# Patient Record
Sex: Female | Born: 1970 | Race: Black or African American | Hispanic: No | Marital: Married | Smoking: Never smoker
Health system: Southern US, Community
[De-identification: ages and names within clinical notes are randomized; demographics above are authoritative.]

## PROBLEM LIST (undated history)

## (undated) ENCOUNTER — Ambulatory Visit: Payer: BC Managed Care – PPO

## (undated) DIAGNOSIS — I10 Essential (primary) hypertension: Secondary | ICD-10-CM

## (undated) DIAGNOSIS — D649 Anemia, unspecified: Secondary | ICD-10-CM

## (undated) DIAGNOSIS — Z8639 Personal history of other endocrine, nutritional and metabolic disease: Secondary | ICD-10-CM

## (undated) DIAGNOSIS — E119 Type 2 diabetes mellitus without complications: Secondary | ICD-10-CM

## (undated) DIAGNOSIS — D251 Intramural leiomyoma of uterus: Secondary | ICD-10-CM

## (undated) DIAGNOSIS — I82409 Acute embolism and thrombosis of unspecified deep veins of unspecified lower extremity: Secondary | ICD-10-CM

## (undated) DIAGNOSIS — G4730 Sleep apnea, unspecified: Secondary | ICD-10-CM

## (undated) HISTORY — PX: TONSILLECTOMY: SUR1361

## (undated) HISTORY — PX: TUBAL LIGATION: SHX77

## (undated) HISTORY — PX: BARIATRIC SURGERY: SHX1103

## (undated) HISTORY — PX: NASAL POLYP EXCISION: SHX2068

## (undated) HISTORY — PX: ENDOMETRIAL BIOPSY: SHX622

---

## 2006-02-22 ENCOUNTER — Ambulatory Visit: Payer: Self-pay | Admitting: Family Medicine

## 2006-10-03 ENCOUNTER — Ambulatory Visit: Payer: Self-pay | Admitting: Internal Medicine

## 2008-08-20 ENCOUNTER — Ambulatory Visit: Payer: Self-pay | Admitting: Family Medicine

## 2008-08-25 ENCOUNTER — Ambulatory Visit: Payer: Self-pay | Admitting: Family Medicine

## 2009-04-08 ENCOUNTER — Ambulatory Visit: Payer: Self-pay | Admitting: Otolaryngology

## 2009-06-09 ENCOUNTER — Ambulatory Visit: Payer: Self-pay | Admitting: Otolaryngology

## 2009-06-14 ENCOUNTER — Ambulatory Visit: Payer: Self-pay | Admitting: Otolaryngology

## 2010-05-19 ENCOUNTER — Ambulatory Visit: Payer: Self-pay | Admitting: Family Medicine

## 2010-06-10 ENCOUNTER — Ambulatory Visit: Payer: Self-pay | Admitting: Specialist

## 2010-06-20 ENCOUNTER — Ambulatory Visit: Payer: Self-pay | Admitting: Specialist

## 2010-07-06 ENCOUNTER — Ambulatory Visit: Payer: Self-pay | Admitting: Specialist

## 2010-07-19 ENCOUNTER — Ambulatory Visit: Payer: Self-pay | Admitting: Specialist

## 2010-07-20 ENCOUNTER — Ambulatory Visit: Payer: Self-pay | Admitting: Gastroenterology

## 2010-08-10 ENCOUNTER — Ambulatory Visit: Payer: Self-pay | Admitting: Specialist

## 2010-10-09 ENCOUNTER — Ambulatory Visit: Payer: Self-pay | Admitting: Internal Medicine

## 2010-10-09 ENCOUNTER — Inpatient Hospital Stay: Payer: Self-pay | Admitting: Specialist

## 2011-01-30 ENCOUNTER — Ambulatory Visit: Payer: Self-pay | Admitting: Family Medicine

## 2011-03-21 DIAGNOSIS — I82409 Acute embolism and thrombosis of unspecified deep veins of unspecified lower extremity: Secondary | ICD-10-CM

## 2011-03-21 HISTORY — DX: Acute embolism and thrombosis of unspecified deep veins of unspecified lower extremity: I82.409

## 2012-05-07 ENCOUNTER — Ambulatory Visit: Payer: Self-pay | Admitting: Anesthesiology

## 2012-05-09 ENCOUNTER — Ambulatory Visit: Payer: Self-pay | Admitting: Otolaryngology

## 2014-03-11 ENCOUNTER — Ambulatory Visit: Payer: Self-pay | Admitting: Family Medicine

## 2014-03-26 ENCOUNTER — Ambulatory Visit: Payer: Self-pay | Admitting: Family Medicine

## 2015-05-07 ENCOUNTER — Other Ambulatory Visit: Payer: Self-pay | Admitting: Family Medicine

## 2015-05-07 DIAGNOSIS — Z1231 Encounter for screening mammogram for malignant neoplasm of breast: Secondary | ICD-10-CM

## 2015-05-10 ENCOUNTER — Ambulatory Visit
Admission: RE | Admit: 2015-05-10 | Discharge: 2015-05-10 | Disposition: A | Discharge status: Home / Self Care | Payer: BLUE CROSS/BLUE SHIELD | Source: Ambulatory Visit | Attending: Family Medicine | Admitting: Family Medicine

## 2015-05-10 DIAGNOSIS — Z1231 Encounter for screening mammogram for malignant neoplasm of breast: Secondary | ICD-10-CM | POA: Diagnosis not present

## 2015-09-13 IMAGING — MG MM DIGITAL SCREENING BILAT W/ CAD
1 series · 4 of 4 positions shown · non-contrast
Comparison: Previous exam(s).

CLINICAL DATA: Screening.

EXAM:
DIGITAL SCREENING BILATERAL MAMMOGRAM WITH CAD

[R CC · right · 4 of 4 slices shown]
[im 1/4]
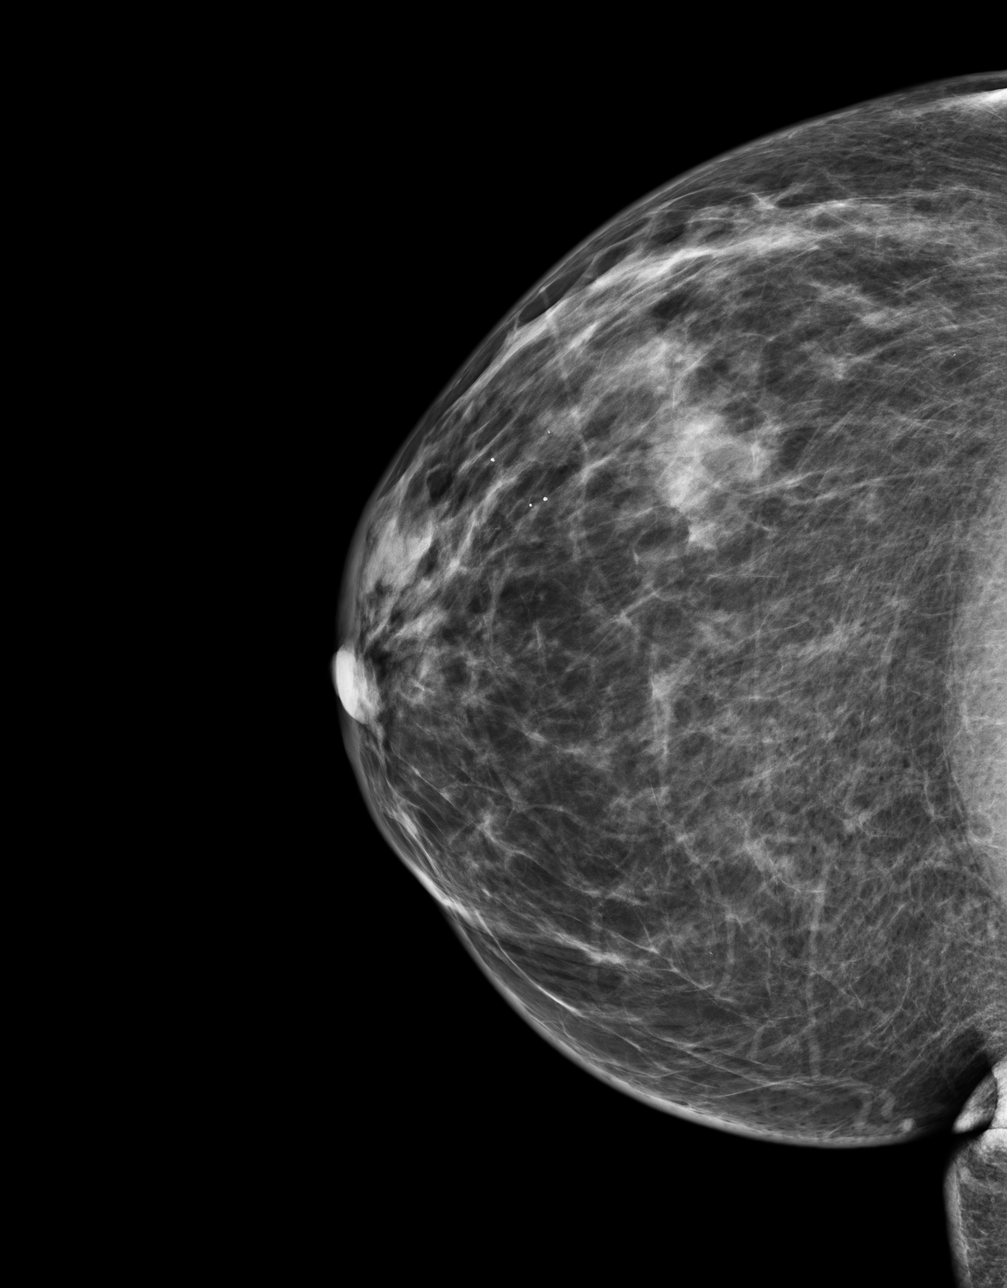
[im 2/4]
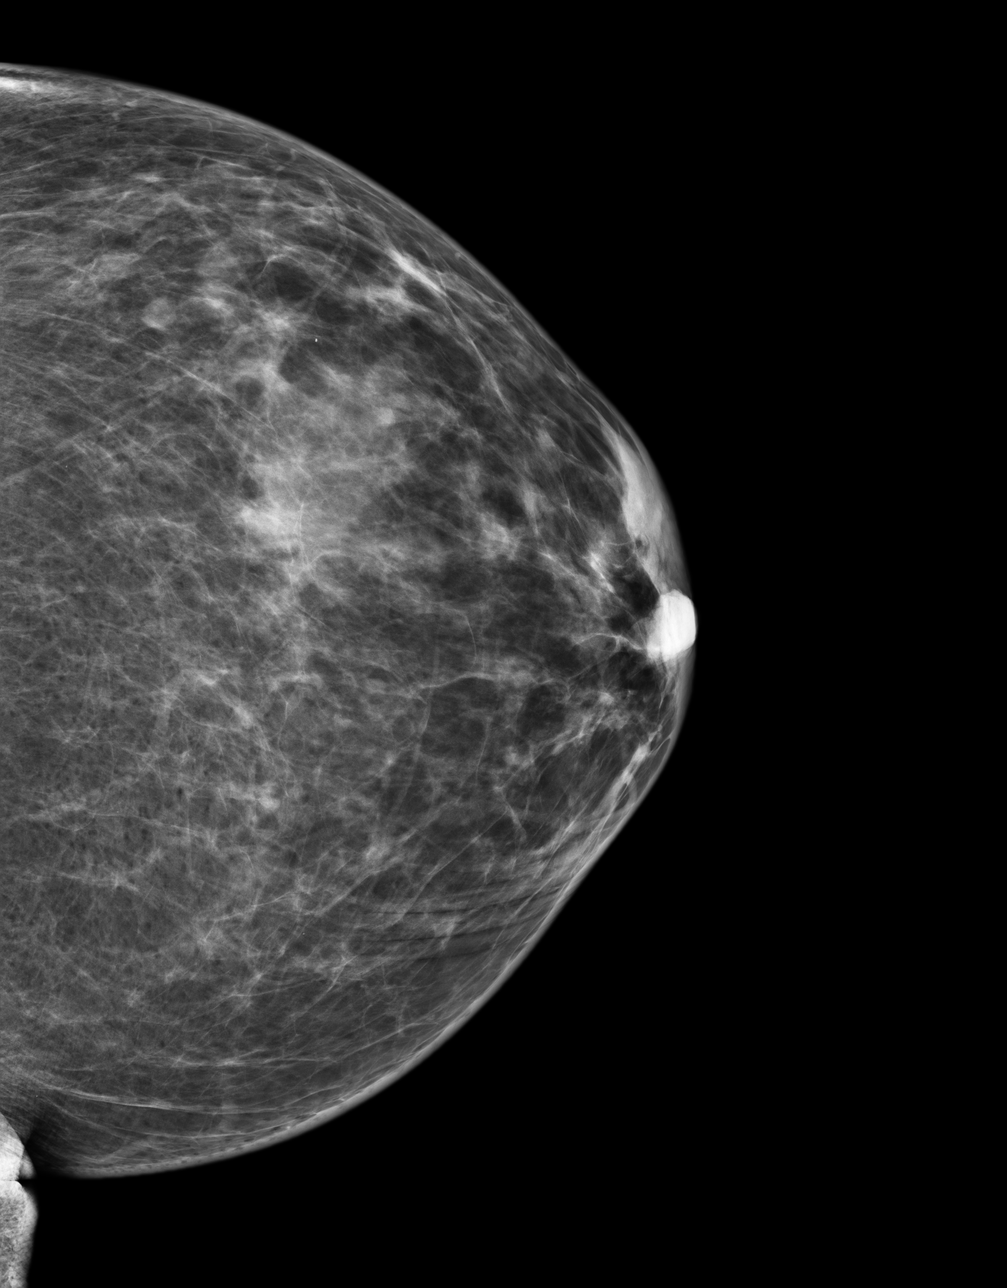
[im 3/4]
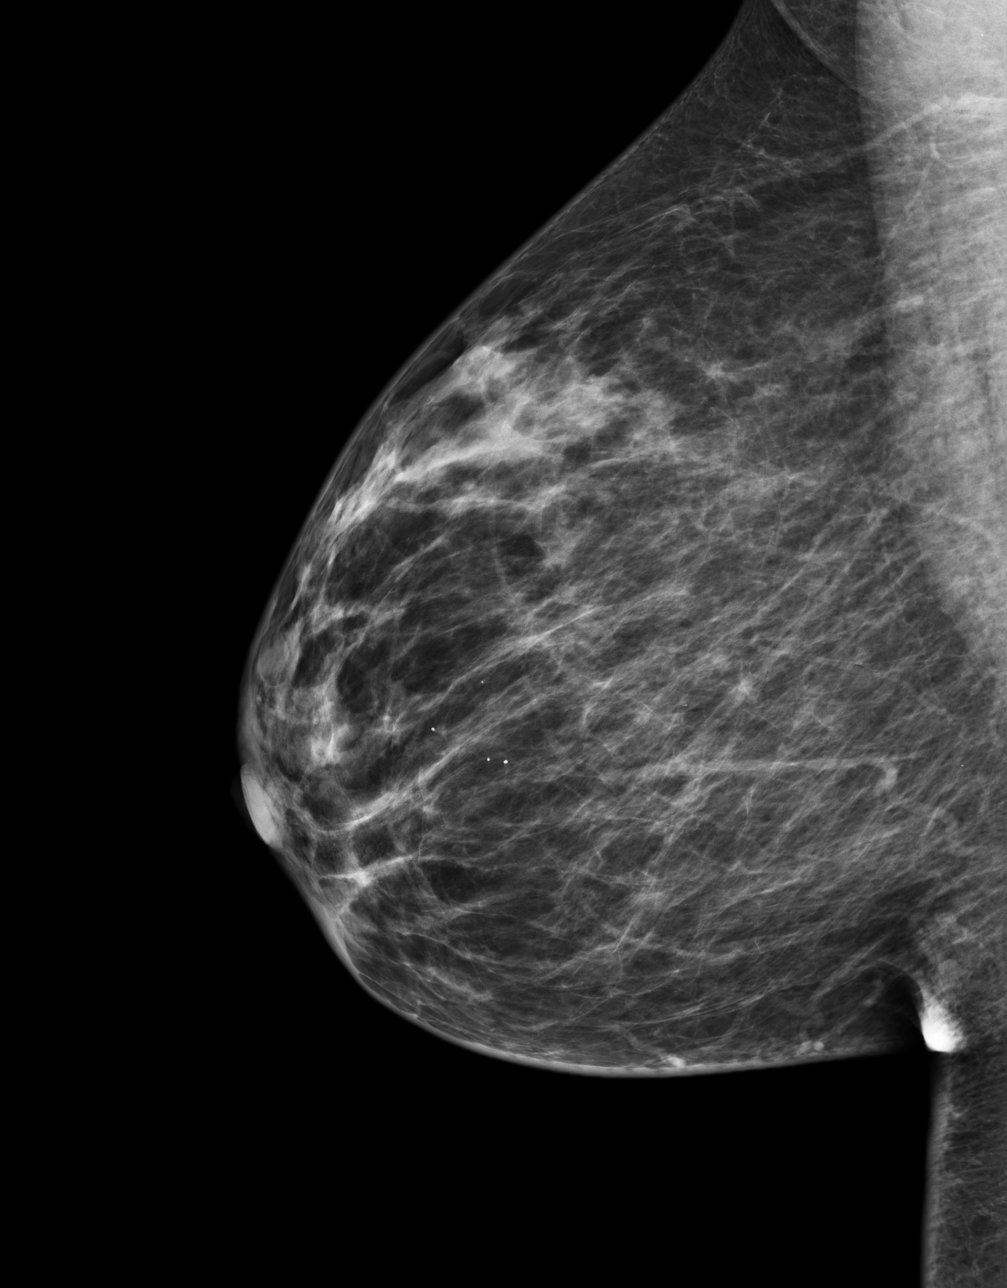
[im 4/4]
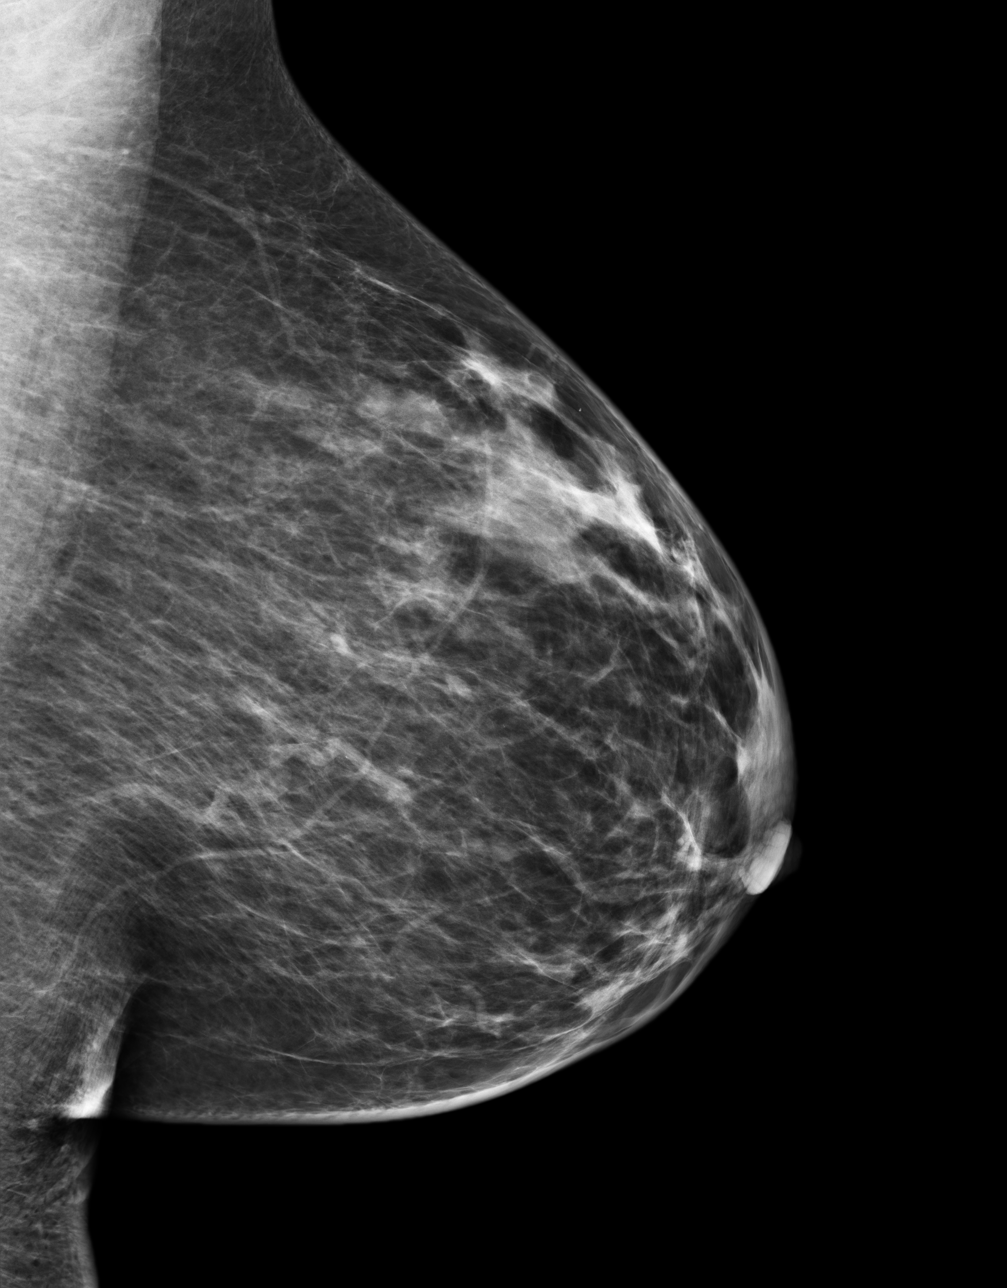

[4 of 4 positions shown; findings below may reference images not displayed]

ACR Breast Density Category c: The breast tissue is heterogeneously
dense, which may obscure small masses.
FINDINGS: There are no findings suspicious for malignancy. Images were
processed with CAD.
IMPRESSION: No mammographic evidence of malignancy. A result letter of this
screening mammogram will be mailed directly to the patient.

RECOMMENDATION:
Screening mammogram in one year. (Code:[id number])

BI-RADS CATEGORY  1: Negative.

## 2016-01-01 ENCOUNTER — Encounter: Payer: Self-pay | Admitting: Gynecology

## 2016-01-01 ENCOUNTER — Ambulatory Visit
Admission: RE | Admit: 2016-01-01 | Discharge: 2016-01-01 | Disposition: A | Discharge status: Home / Self Care | Payer: 59 | Source: Ambulatory Visit | Attending: Family Medicine | Admitting: Family Medicine

## 2016-01-01 ENCOUNTER — Ambulatory Visit
Admission: EM | Admit: 2016-01-01 | Discharge: 2016-01-01 | Disposition: A | Discharge status: Home / Self Care | Payer: 59 | Attending: Family Medicine | Admitting: Family Medicine

## 2016-01-01 DIAGNOSIS — M79661 Pain in right lower leg: Secondary | ICD-10-CM | POA: Insufficient documentation

## 2016-01-01 DIAGNOSIS — R600 Localized edema: Secondary | ICD-10-CM | POA: Insufficient documentation

## 2016-01-01 DIAGNOSIS — M79604 Pain in right leg: Secondary | ICD-10-CM | POA: Diagnosis not present

## 2016-01-01 HISTORY — DX: Acute embolism and thrombosis of unspecified deep veins of unspecified lower extremity: I82.409

## 2016-01-01 HISTORY — DX: Intramural leiomyoma of uterus: D25.1

## 2016-01-01 HISTORY — DX: Type 2 diabetes mellitus without complications: E11.9

## 2016-01-01 HISTORY — DX: Sleep apnea, unspecified: G47.30

## 2016-01-01 HISTORY — DX: Essential (primary) hypertension: I10

## 2016-01-01 HISTORY — DX: Personal history of other endocrine, nutritional and metabolic disease: Z86.39

## 2016-01-01 HISTORY — DX: Anemia, unspecified: D64.9

## 2016-01-01 MED ORDER — HYDROCODONE-ACETAMINOPHEN 5-325 MG PO TABS: ORAL_TABLET | ORAL | 0 refills | Status: DC

## 2016-01-01 NOTE — ED Triage Notes (Signed)
Per patient notice x 5 days ago right leg swollen and painful.

## 2016-01-01 NOTE — ED Provider Notes (Signed)
[facility]    CSN: [id number] Arrival date & time: 01/01/16  1132     History   Chief Complaint Chief Complaint  Patient presents with  . Leg Pain    HPI Debra Nunez is a 45 y.o. female.   45 yo female with a 5 days h/o right leg pain and swelling mainly around the calf. States she has a prior h/o DVT years ago and was on a 3 hour flight 1 week ago (2 days prior to current symptoms). Denies any trauma/injury, fevers, chills, chest pain or shortness of breath.    The history is provided by the patient.  Leg Pain  Location:  Leg Injury: no     Past Medical History:  Diagnosis Date  . Anemia   . Diabetes mellitus without complication ([place])   . DVT (deep venous thrombosis) ([place])   . Fibroids, intramural   . H/O vitamin D deficiency   . Hypertension   . Intramural leiomyoma of uterus   . Sleep apnea   . Stroke [facility])     There are no active problems to display for this patient.   Past Surgical History:  Procedure Laterality Date  . BARIATRIC SURGERY    . ENDOMETRIAL BIOPSY    . TONSILLECTOMY    . TUBAL LIGATION      OB History    No data available       Home Medications    Prior to Admission medications   Medication Sig Start Date End Date Taking? Authorizing Provider  amLODipine (NORVASC) 5 MG tablet Take 5 mg by mouth daily.   Yes Historical Provider, MD  CALCIUM MAGNESIUM 750 PO Take by mouth.   Yes Historical Provider, MD  clonazePAM (KLONOPIN) 0.5 MG tablet Take 0.5 mg by mouth 2 (two) times daily as needed for anxiety.   Yes Historical Provider, MD  cyanocobalamin 1000 MCG tablet Take 5,000 mcg by mouth daily.   Yes Historical Provider, MD  ergocalciferol (VITAMIN D2) 50000 units capsule Take 50,000 Units by mouth once a week.   Yes Historical Provider, MD  ferrous sulfate (FERRO-BOB) 325 (65 FE) MG tablet Take 325 mg by mouth daily with breakfast.   Yes Historical Provider, MD  hydrochlorothiazide (HYDRODIURIL) 25 MG tablet  Take 25 mg by mouth daily.   Yes Historical Provider, MD  leuprolide (LUPRON) 11.25 MG injection Inject 11.25 mg into the muscle every 3 (three) months.   Yes Historical Provider, MD  HYDROcodone-acetaminophen (NORCO/VICODIN) 5-325 MG tablet 1-2 tabs po bid prn 01/01/16   Norval Gable, MD    Family History No family history on file.  Social History Social History  Substance Use Topics  . Smoking status: Not on file  . Smokeless tobacco: Not on file  . Alcohol use Not on file     Allergies   Tramadol   Review of Systems Review of Systems   Physical Exam Triage Vital Signs ED Triage Vitals  Enc Vitals Group     BP 01/01/16 1200 125/60     Pulse Rate 01/01/16 1200 72     Resp 01/01/16 1200 18     Temp 01/01/16 1200 98.2 F (36.8 C)     Temp Source 01/01/16 1200 Oral     SpO2 01/01/16 1200 100 %     Weight 01/01/16 1203 252 lb (114.3 kg)     Height 01/01/16 1203 5\' 7"  (1.702 m)     Head Circumference --      Peak  Flow --      Pain Score 01/01/16 1206 6     Pain Loc --      Pain Edu? --      Excl. in [place]? --    No data found.   Updated Vital Signs BP 125/60 (BP Location: Right Arm)   Pulse 72   Temp 98.2 F (36.8 C) (Oral)   Resp 18   Ht 5\' 7"  (1.702 m)   Wt 252 lb (114.3 kg)   LMP 06/01/2015 Comment: taking Lupron,depo  SpO2 100%   BMI 39.47 kg/m   Visual Acuity Right Eye Distance:   Left Eye Distance:   Bilateral Distance:    Right Eye Near:   Left Eye Near:    Bilateral Near:     Physical Exam   UC Treatments / Results  Labs (all labs ordered are listed, but only abnormal results are displayed) Labs Reviewed - No data to display  EKG  EKG Interpretation None       Radiology US Venous Img Lower Unilateral Right  Result Date: 01/01/2016 CLINICAL DATA:  45 year old female with a history of lower leg pain EXAM: Right LOWER EXTREMITY VENOUS DOPPLER ULTRASOUND TECHNIQUE: Gray-scale sonography with graded compression, as well as color  Doppler and duplex ultrasound were performed to evaluate the lower extremity deep venous systems from the level of the common femoral vein and including the common femoral, femoral, profunda femoral, popliteal and calf veins including the posterior tibial, peroneal and gastrocnemius veins when visible. The superficial great saphenous vein was also interrogated. Spectral Doppler was utilized to evaluate flow at rest and with distal augmentation maneuvers in the common femoral, femoral and popliteal veins. COMPARISON:  01/30/2011, 10/09/2010 FINDINGS: Contralateral Common Femoral Vein: Respiratory phasicity is normal and symmetric with the symptomatic side. No evidence of thrombus. Normal compressibility. Common Femoral Vein: No evidence of thrombus. Normal compressibility, respiratory phasicity and response to augmentation. Saphenofemoral Junction: No evidence of thrombus. Normal compressibility and flow on color Doppler imaging. Profunda Femoral Vein: No evidence of thrombus. Normal compressibility and flow on color Doppler imaging. Femoral Vein: No evidence of thrombus. Normal compressibility, respiratory phasicity and response to augmentation. Popliteal Vein: No evidence of thrombus. Normal compressibility, respiratory phasicity and response to augmentation. Calf Veins: No evidence of thrombus. Normal compressibility and flow on color Doppler imaging. Superficial Great Saphenous Vein: No evidence of thrombus. Normal compressibility and flow on color Doppler imaging. Other Findings:  Edema of the right lower extremity peer IMPRESSION: Sonographic survey right lower extremity negative for DVT. Edema of the superficial tissues, right lower leg. Signed, Dulcy Fanny. Earleen Newport, DO [facility] Radiology Electronically Signed   By: Corrie Mckusick D.O.   On: 01/01/2016 13:43    Procedures Procedures (including critical care time)  Medications Ordered in UC Medications - No  data to display   Initial Impression / Assessment and Plan / UC Course  I have reviewed the triage vital signs and the nursing notes.  Pertinent labs & imaging results that were available during my care of the patient were reviewed by me and considered in my medical decision making (see chart for details).  Clinical Course      Final Clinical Impressions(s) / UC Diagnoses   Final diagnoses:  Right leg pain  Right calf pain    New Prescriptions Discharge Medication List as of 01/01/2016 12:23 PM    START taking these medications   Details  HYDROcodone-acetaminophen (NORCO/VICODIN) 5-325 MG tablet 1-2 tabs  po bid prn, Print        1. x-ray results (venous doppler of LE negative for DVT) and diagnosis reviewed with patient 2. rx as per orders above; reviewed possible side effects, interactions, risks and benefits  3. Recommend supportive treatment with rest, elevation 4. Follow-up prn if symptoms worsen or don't improve   Norval Gable, MD 01/01/16 1355

## 2016-04-20 HISTORY — PX: ABDOMINAL HYSTERECTOMY: SHX81

## 2016-04-25 NOTE — H&P (Signed)
Debra Nunez is a 46 y.o. female here for [facility] and bilateral salpingectomy  .pt with [facility]  and is here to discuss definitive tx ie .Marland Kitchen TVH . Pt with embx 04/2015 , Pap 1/18 neg .  U/S shows at least 3 fibroids 4-6 cm .  + H/o DVT   Past Medical History:  has a past medical history of Abnormal uterine bleeding, unspecified; Allergic state; Anemia, unspecified; Constipation due to slow transit; Diabetes mellitus type 2, uncomplicated (CMS-HCC); DVT of axillary vein, acute right (CMS-HCC) (04/23/2015); History of DVT (deep vein thrombosis) (04/27/2015); Hypertension; Other nonspecific abnormal finding; and Sleep apnea.  Past Surgical History:  has a past surgical history that includes Tonsillectomy; Tubal ligation; Bariatric Surgery; and Endometrial Biopsy W/Wo Endocerv Biopsy [id number]) (05/13/2015). Family History: family history includes Allergic rhinitis in her mother; Diabetes type II in her mother; High blood pressure (Hypertension) in her father and mother; Obesity in her sister. Social History:  reports that she has never smoked. She has never used smokeless tobacco. She reports that she drinks alcohol. She reports that she does not use drugs. OB/GYN History:          OB History    Gravida Para Term Preterm AB Living   2 2 2     2    SAB TAB Ectopic Molar Multiple Live Births             2      Allergies: is allergic to amlodipine and ultram [tramadol]. Medications:  Current Outpatient Prescriptions:  .  cyanocobalamin, vitamin B-12, (VITAMIN B-12) 5,000 mcg Subl, Place under the tongue., Disp: , Rfl:  .  ergocalciferol, vitamin D2, (VITAMIN D2) 50,000 unit capsule, Take 50,000 Units by mouth once a week., Disp: , Rfl:  .  ferrous sulfate 325 (65 FE) MG tablet, Take 325 mg by mouth daily with breakfast., Disp: , Rfl:  .  hydroCHLOROthiazide (HYDRODIURIL) 25 MG tablet, TAKE 1 TABLET BY MOUTH EVERY DAY AS NEEDED, Disp: 30 tablet, Rfl: prn .  clonazePAM (KLONOPIN) 0.5 MG tablet, Take 1 tablet  (0.5 mg total) by mouth once daily as needed for Anxiety. (Patient not taking: Reported on 04/10/2016 ), Disp: 10 tablet, Rfl: 0  Review of Systems: General:                      No fatigue or weight loss Eyes:                           No vision changes Ears:                            No hearing difficulty Respiratory:                No cough or shortness of breath Pulmonary:                  No asthma or shortness of breath Cardiovascular:           No chest pain, palpitations, dyspnea on exertion Gastrointestinal:          No abdominal bloating, chronic diarrhea, constipations, masses, pain or hematochezia Genitourinary:             No hematuria, dysuria, abnormal vaginal discharge, pelvic pain, Menometrorrhagia Lymphatic:                   No swollen lymph nodes Musculoskeletal:  No muscle weakness Neurologic:                  No extremity weakness, syncope, seizure disorder Psychiatric:                  No history of depression, delusions or suicidal/homicidal ideation    Exam:      Vitals:   04/10/16 0951  BP: 134/83  Pulse: 83    Body mass index is 39.78 kg/m.  WDWN / black female in NAD   Lungs: CTA  CV : RRR without murmur   Breast: exam done in sitting and lying position : No dimpling or retraction, no dominant mass, no spontaneous discharge, no axillary adenopathy Neck:  no thyromegaly Abdomen: soft , no mass, normal active bowel sounds,  non-tender, no rebound tenderness Pelvic: tanner stage 5 ,  External genitalia: vulva /labia no lesions Urethra: no prolapse Vagina: normal physiologic d/c, adequate room for TVH  Cervix: no lesions, no cervical motion tenderness   Uterus: 12 weeks irregular  Adnexa: no mass,  non-tender   Rectovaginal: no mass heme negative  Impression:   The primary encounter diagnosis was Menorrhagia with regular cycle. A diagnosis of Fibroids, intramural was also pertinent to this visit.    Plan:  Benefits and  risks to surgery: TVH and bilateral salpingectomy  The proposed benefit of the surgery has been discussed with the patient. The possible risks include, but are not limited to: organ injury to the bowel , bladder, ureters, and major blood vessels and nerves. There is a possibility of additional surgeries resulting from these injuries. There is also the risk of blood transfusion and the need to receive blood products during or after the procedure which may rarely lead to HIV or Hepatitis C infection. There is a risk of developing a deep venous thrombosis or a pulmonary embolism . There is the possibility of wound infection and also anesthetic complications, even the rare possibility of death. The patient understands these risks and wishes to proceed. All questions have been answered and the consent has been signed. I have spoken with the patient regarding treatment options including expectant management, hormonal options, or surgical intervention. After a full discussion the pt elects to proceed [place] . Pt is not a candidate for combination OCP given her h/o DVT  Offered LSH , but with dyspareunia I think she would be best served with removal of the cervix     Caroline Sauger, MD

## 2016-04-26 ENCOUNTER — Encounter
Admission: RE | Admit: 2016-04-26 | Discharge: 2016-04-26 | Disposition: A | Discharge status: Home / Self Care | Payer: 59 | Source: Ambulatory Visit | Attending: Obstetrics and Gynecology | Admitting: Obstetrics and Gynecology

## 2016-04-26 DIAGNOSIS — Z01818 Encounter for other preprocedural examination: Secondary | ICD-10-CM | POA: Diagnosis not present

## 2016-04-26 DIAGNOSIS — D259 Leiomyoma of uterus, unspecified: Secondary | ICD-10-CM | POA: Insufficient documentation

## 2016-04-26 DIAGNOSIS — I10 Essential (primary) hypertension: Secondary | ICD-10-CM | POA: Insufficient documentation

## 2016-04-26 DIAGNOSIS — N920 Excessive and frequent menstruation with regular cycle: Secondary | ICD-10-CM | POA: Insufficient documentation

## 2016-04-26 DIAGNOSIS — Z0183 Encounter for blood typing: Secondary | ICD-10-CM | POA: Diagnosis not present

## 2016-04-26 DIAGNOSIS — Z01812 Encounter for preprocedural laboratory examination: Secondary | ICD-10-CM | POA: Diagnosis not present

## 2016-04-26 LAB — BASIC METABOLIC PANEL
Anion gap: 5 (ref 5–15)
BUN: 13 mg/dL (ref 6–20)
CHLORIDE: 107 mmol/L (ref 101–111)
CO2: 29 mmol/L (ref 22–32)
CREATININE: 0.72 mg/dL (ref 0.44–1.00)
Calcium: 8.5 mg/dL — ABNORMAL LOW (ref 8.9–10.3)
GFR calc Af Amer: >60 mL/min (ref >60)
GFR calc non Af Amer: >60 mL/min (ref >60)
GLUCOSE: 120 mg/dL — AB (ref 65–99)
POTASSIUM: 3.7 mmol/L (ref 3.5–5.1)
SODIUM: 141 mmol/L (ref 135–145)

## 2016-04-26 LAB — TYPE AND SCREEN
ABO/RH(D): O POS
Antibody Screen: NEGATIVE

## 2016-04-26 LAB — CBC
HEMATOCRIT: 37.6 % (ref 35.0–47.0)
Hemoglobin: 12.6 g/dL (ref 12.0–16.0)
MCH: 29.8 pg (ref 26.0–34.0)
MCHC: 33.5 g/dL (ref 32.0–36.0)
MCV: 89.2 fL (ref 80.0–100.0)
PLATELETS: 257 10*3/uL (ref 150–440)
RBC: 4.21 MIL/uL (ref 3.80–5.20)
RDW: 12.2 % (ref 11.5–14.5)
WBC: 5.5 10*3/uL (ref 3.6–11.0)

## 2016-04-26 NOTE — Patient Instructions (Signed)
  Your procedure is scheduled on: 05-01-16 [facility]) Report to Same Day Surgery 2nd floor medical mall [facility] Entrance-take elevator on left to 2nd floor.  Check in with surgery information desk.) To find out your arrival time please call [phone] between 1PM - 3PM on 04-28-16 (FRIDAY)  Remember: Instructions that are not followed completely may result in serious medical risk, up to and including death, or upon the discretion of your surgeon and anesthesiologist your surgery may need to be rescheduled.    _x___ 1. Do not eat food or drink liquids after midnight. No gum chewing or hard candies.     __x__ 2. No Alcohol for 24 hours before or after surgery.   __x__3. No Smoking for 24 prior to surgery.   ____  4. Bring all medications with you on the day of surgery if instructed.    __x__ 5. Notify your doctor if there is any change in your medical condition     (cold, fever, infections).     Do not wear jewelry, make-up, hairpins, clips or nail polish.  Do not wear lotions, powders, or perfumes. You may wear deodorant.  Do not shave 48 hours prior to surgery. Men may shave face and neck.  Do not bring valuables to the hospital.    [facility] is not responsible for any belongings or valuables.               Contacts, dentures or bridgework may not be worn into surgery.  Leave your suitcase in the car. After surgery it may be brought to your room.  For patients admitted to the hospital, discharge time is determined by your treatment team.   Patients discharged the day of surgery will not be allowed to drive home.  You will need someone to drive you home and stay with you the night of your procedure.    Please read over the following fact sheets that you were given:   [facility] Preparing for Surgery and or MRSA Information   ____ Take these medicines the morning of surgery with A SIP OF WATER:    1. NONE  2.  3.  4.  5.  6.  ____Fleets enema or Magnesium Citrate as  directed.   ____ Use CHG Soap or sage wipes as directed on instruction sheet   ____ Use inhalers on the day of surgery and bring to hospital day of surgery  ____ Stop metformin 2 days prior to surgery    ____ Take 1/2 of usual insulin dose the night before surgery and none on the morning of  surgery.   ____ Stop Aspirin, Coumadin, Pllavix ,Eliquis, Effient, or Pradaxa  x__ Stop Anti-inflammatories such as Advil, Aleve, Ibuprofen, Motrin, Naproxen,          Naprosyn, Goodies powders or aspirin products NOW-Ok to take Tylenol.   ____ Stop supplements until after surgery.    ____ Bring C-Pap to the hospital.

## 2016-04-28 ENCOUNTER — Encounter: Payer: Self-pay | Admitting: *Deleted

## 2016-04-30 MED ORDER — CEFOXITIN SODIUM-DEXTROSE 2-2.2 GM-% IV SOLR (PREMIX)
2.0000 g | INTRAVENOUS | Status: AC
  Administered 2016-05-01: 2 g via INTRAVENOUS

## 2016-05-01 ENCOUNTER — Ambulatory Visit: Payer: 59 | Admitting: Certified Registered Nurse Anesthetist

## 2016-05-01 ENCOUNTER — Encounter
Admission: RE | Disposition: A | Discharge status: Home / Self Care | Payer: Self-pay | Source: Ambulatory Visit | Attending: Obstetrics and Gynecology

## 2016-05-01 ENCOUNTER — Observation Stay
Admission: RE | Admit: 2016-05-01 | Discharge: 2016-05-02 | Disposition: A | Discharge status: Home / Self Care | Payer: 59 | Source: Ambulatory Visit | Attending: Obstetrics and Gynecology | Admitting: Obstetrics and Gynecology

## 2016-05-01 ENCOUNTER — Encounter: Payer: Self-pay | Admitting: *Deleted

## 2016-05-01 DIAGNOSIS — Z888 Allergy status to other drugs, medicaments and biological substances status: Secondary | ICD-10-CM | POA: Insufficient documentation

## 2016-05-01 DIAGNOSIS — Z9889 Other specified postprocedural states: Secondary | ICD-10-CM

## 2016-05-01 DIAGNOSIS — N800 Endometriosis of uterus: Secondary | ICD-10-CM | POA: Insufficient documentation

## 2016-05-01 DIAGNOSIS — I10 Essential (primary) hypertension: Secondary | ICD-10-CM | POA: Diagnosis not present

## 2016-05-01 DIAGNOSIS — G4730 Sleep apnea, unspecified: Secondary | ICD-10-CM | POA: Insufficient documentation

## 2016-05-01 DIAGNOSIS — E119 Type 2 diabetes mellitus without complications: Secondary | ICD-10-CM | POA: Insufficient documentation

## 2016-05-01 DIAGNOSIS — N920 Excessive and frequent menstruation with regular cycle: Principal | ICD-10-CM | POA: Insufficient documentation

## 2016-05-01 DIAGNOSIS — Z86718 Personal history of other venous thrombosis and embolism: Secondary | ICD-10-CM | POA: Insufficient documentation

## 2016-05-01 DIAGNOSIS — N72 Inflammatory disease of cervix uteri: Secondary | ICD-10-CM | POA: Diagnosis not present

## 2016-05-01 DIAGNOSIS — D251 Intramural leiomyoma of uterus: Secondary | ICD-10-CM | POA: Diagnosis not present

## 2016-05-01 DIAGNOSIS — Z885 Allergy status to narcotic agent status: Secondary | ICD-10-CM | POA: Insufficient documentation

## 2016-05-01 HISTORY — PX: BILATERAL SALPINGECTOMY: SHX5743

## 2016-05-01 HISTORY — PX: VAGINAL HYSTERECTOMY: SHX2639

## 2016-05-01 LAB — POCT PREGNANCY, URINE: PREG TEST UR: NEGATIVE

## 2016-05-01 LAB — ABO/RH: ABO/RH(D): O POS

## 2016-05-01 LAB — GLUCOSE, CAPILLARY
GLUCOSE-CAPILLARY: 93 mg/dL (ref 65–99)
Glucose-Capillary: 145 mg/dL — ABNORMAL HIGH (ref 65–99)

## 2016-05-01 SURGERY — HYSTERECTOMY, VAGINAL
Anesthesia: General

## 2016-05-01 MED ORDER — ONDANSETRON HCL 4 MG PO TABS: 4.0000 mg | ORAL_TABLET | Freq: Four times a day (QID) | ORAL | Status: DC | PRN

## 2016-05-01 MED ORDER — ROCURONIUM BROMIDE 50 MG/5ML IV SOLN
INTRAVENOUS | Status: AC
  Filled 2016-05-01: qty 1

## 2016-05-01 MED ORDER — FENTANYL CITRATE (PF) 100 MCG/2ML IJ SOLN
INTRAMUSCULAR | Status: DC | PRN
  Administered 2016-05-01: 50 ug via INTRAVENOUS
  Administered 2016-05-01 (×2): 25 ug via INTRAVENOUS

## 2016-05-01 MED ORDER — ONDANSETRON HCL 4 MG/2ML IJ SOLN: 4.0000 mg | Freq: Once | INTRAMUSCULAR | Status: DC | PRN

## 2016-05-01 MED ORDER — KETOROLAC TROMETHAMINE 30 MG/ML IJ SOLN
INTRAMUSCULAR | Status: AC
  Filled 2016-05-01: qty 1

## 2016-05-01 MED ORDER — SUGAMMADEX SODIUM 200 MG/2ML IV SOLN
INTRAVENOUS | Status: DC | PRN
  Administered 2016-05-01: 200 mg via INTRAVENOUS

## 2016-05-01 MED ORDER — DEXAMETHASONE SODIUM PHOSPHATE 10 MG/ML IJ SOLN
INTRAMUSCULAR | Status: AC
  Filled 2016-05-01: qty 1

## 2016-05-01 MED ORDER — PHENYLEPHRINE HCL 10 MG/ML IJ SOLN
INTRAMUSCULAR | Status: DC | PRN
  Administered 2016-05-01: 50 ug via INTRAVENOUS

## 2016-05-01 MED ORDER — HYDROMORPHONE HCL 1 MG/ML IJ SOLN
INTRAMUSCULAR | Status: DC | PRN
  Administered 2016-05-01 (×2): .25 mg via INTRAVENOUS

## 2016-05-01 MED ORDER — SEVOFLURANE IN SOLN
RESPIRATORY_TRACT | Status: AC
  Filled 2016-05-01: qty 250

## 2016-05-01 MED ORDER — LIDOCAINE-EPINEPHRINE (PF) 1 %-1:200000 IJ SOLN
INTRAMUSCULAR | Status: AC
  Filled 2016-05-01: qty 30

## 2016-05-01 MED ORDER — SUCCINYLCHOLINE CHLORIDE 20 MG/ML IJ SOLN
INTRAMUSCULAR | Status: DC | PRN
  Administered 2016-05-01: 110 mg via INTRAVENOUS

## 2016-05-01 MED ORDER — ONDANSETRON HCL 4 MG/2ML IJ SOLN
INTRAMUSCULAR | Status: DC | PRN
  Administered 2016-05-01: 4 mg via INTRAVENOUS

## 2016-05-01 MED ORDER — MIDAZOLAM HCL 2 MG/2ML IJ SOLN
INTRAMUSCULAR | Status: DC | PRN
  Administered 2016-05-01: 2 mg via INTRAVENOUS

## 2016-05-01 MED ORDER — HYDROMORPHONE HCL 1 MG/ML IJ SOLN
INTRAMUSCULAR | Status: AC
  Filled 2016-05-01: qty 1

## 2016-05-01 MED ORDER — LACTATED RINGERS IV SOLN: INTRAVENOUS | Status: DC | PRN

## 2016-05-01 MED ORDER — ONDANSETRON HCL 4 MG/2ML IJ SOLN
INTRAMUSCULAR | Status: AC
  Filled 2016-05-01: qty 2

## 2016-05-01 MED ORDER — LIDOCAINE-EPINEPHRINE 1 %-1:100000 IJ SOLN
INTRAMUSCULAR | Status: AC
  Filled 2016-05-01: qty 1

## 2016-05-01 MED ORDER — ACETAMINOPHEN 10 MG/ML IV SOLN
INTRAVENOUS | Status: DC | PRN
  Administered 2016-05-01: 1000 mg via INTRAVENOUS

## 2016-05-01 MED ORDER — FENTANYL CITRATE (PF) 100 MCG/2ML IJ SOLN
INTRAMUSCULAR | Status: AC
  Administered 2016-05-01: 25 ug via INTRAVENOUS
  Filled 2016-05-01: qty 2

## 2016-05-01 MED ORDER — FENTANYL CITRATE (PF) 100 MCG/2ML IJ SOLN
INTRAMUSCULAR | Status: AC
  Filled 2016-05-01: qty 2

## 2016-05-01 MED ORDER — MORPHINE SULFATE (PF) 2 MG/ML IV SOLN
1.0000 mg | INTRAVENOUS | Status: DC | PRN
  Administered 2016-05-01: 2 mg via INTRAVENOUS
  Filled 2016-05-01: qty 1

## 2016-05-01 MED ORDER — ONDANSETRON HCL 4 MG/2ML IJ SOLN: 4.0000 mg | Freq: Four times a day (QID) | INTRAMUSCULAR | Status: DC | PRN

## 2016-05-01 MED ORDER — PROPOFOL 10 MG/ML IV BOLUS
INTRAVENOUS | Status: AC
  Filled 2016-05-01: qty 20

## 2016-05-01 MED ORDER — CEFOXITIN SODIUM-DEXTROSE 2-2.2 GM-% IV SOLR (PREMIX)
INTRAVENOUS | Status: AC
  Filled 2016-05-01: qty 50

## 2016-05-01 MED ORDER — ACETAMINOPHEN 10 MG/ML IV SOLN
INTRAVENOUS | Status: AC
  Filled 2016-05-01: qty 100

## 2016-05-01 MED ORDER — ROCURONIUM BROMIDE 100 MG/10ML IV SOLN
INTRAVENOUS | Status: DC | PRN
  Administered 2016-05-01 (×2): 25 mg via INTRAVENOUS
  Administered 2016-05-01: 15 mg via INTRAVENOUS

## 2016-05-01 MED ORDER — DEXAMETHASONE SODIUM PHOSPHATE 10 MG/ML IJ SOLN
INTRAMUSCULAR | Status: DC | PRN
  Administered 2016-05-01: 10 mg via INTRAVENOUS

## 2016-05-01 MED ORDER — LACTATED RINGERS IV SOLN: INTRAVENOUS | Status: DC

## 2016-05-01 MED ORDER — MIDAZOLAM HCL 2 MG/2ML IJ SOLN
INTRAMUSCULAR | Status: AC
  Filled 2016-05-01: qty 2

## 2016-05-01 MED ORDER — SUCCINYLCHOLINE CHLORIDE 20 MG/ML IJ SOLN
INTRAMUSCULAR | Status: AC
  Filled 2016-05-01: qty 1

## 2016-05-01 MED ORDER — LIDOCAINE HCL (PF) 2 % IJ SOLN
INTRAMUSCULAR | Status: AC
  Filled 2016-05-01: qty 2

## 2016-05-01 MED ORDER — FENTANYL CITRATE (PF) 100 MCG/2ML IJ SOLN
25.0000 ug | INTRAMUSCULAR | Status: DC | PRN
  Administered 2016-05-01 (×4): 25 ug via INTRAVENOUS

## 2016-05-01 MED ORDER — LIDOCAINE-EPINEPHRINE 1 %-1:100000 IJ SOLN
INTRAMUSCULAR | Status: DC | PRN
  Administered 2016-05-01: 9 mL

## 2016-05-01 MED ORDER — SODIUM CHLORIDE 0.9 % IV SOLN
INTRAVENOUS | Status: DC
  Administered 2016-05-01 (×2): via INTRAVENOUS

## 2016-05-01 MED ORDER — SODIUM CHLORIDE 0.9% FLUSH
3.0000 mL | Freq: Two times a day (BID) | INTRAVENOUS | Status: DC
  Administered 2016-05-02: 3 mL via INTRAVENOUS

## 2016-05-01 MED ORDER — SUGAMMADEX SODIUM 200 MG/2ML IV SOLN
INTRAVENOUS | Status: AC
  Filled 2016-05-01: qty 2

## 2016-05-01 MED ORDER — LIDOCAINE HCL (CARDIAC) 20 MG/ML IV SOLN
INTRAVENOUS | Status: DC | PRN
  Administered 2016-05-01: 100 mg via INTRAVENOUS

## 2016-05-01 MED ORDER — LACTATED RINGERS IV SOLN
INTRAVENOUS | Status: DC
  Administered 2016-05-01: 15:00:00 via INTRAVENOUS

## 2016-05-01 MED ORDER — FENTANYL CITRATE (PF) 100 MCG/2ML IJ SOLN: 25.0000 ug | INTRAMUSCULAR | Status: DC | PRN

## 2016-05-01 MED ORDER — PROPOFOL 10 MG/ML IV BOLUS
INTRAVENOUS | Status: DC | PRN
  Administered 2016-05-01: 200 mg via INTRAVENOUS

## 2016-05-01 MED ORDER — OXYCODONE-ACETAMINOPHEN 5-325 MG PO TABS
1.0000 | ORAL_TABLET | ORAL | Status: DC | PRN
  Administered 2016-05-01 – 2016-05-02 (×4): 2 via ORAL
  Filled 2016-05-01 (×4): qty 2

## 2016-05-01 MED ORDER — SOD CITRATE-CITRIC ACID 500-334 MG/5ML PO SOLN: 30.0000 mL | ORAL | Status: DC

## 2016-05-01 MED ORDER — KETOROLAC TROMETHAMINE 30 MG/ML IJ SOLN
30.0000 mg | Freq: Three times a day (TID) | INTRAMUSCULAR | Status: DC | PRN
Start: 2016-05-01 — End: 2016-05-01
  Filled 2016-05-01 (×2): qty 1

## 2016-05-01 SURGICAL SUPPLY — 31 items
BAG URO DRAIN 2000ML W/SPOUT (MISCELLANEOUS) ×3 IMPLANT
CANISTER SUCT 1200ML W/VALVE (MISCELLANEOUS) ×3 IMPLANT
CATH FOLEY 2WAY  5CC 16FR (CATHETERS) ×1
CATH ROBINSON RED A/P 16FR (CATHETERS) ×3 IMPLANT
CATH URTH 16FR FL 2W BLN LF (CATHETERS) ×2 IMPLANT
COUNTER NEEDLE 20/40 LG (NEEDLE) ×3 IMPLANT
DRAPE PERI LITHO V/GYN (MISCELLANEOUS) ×3 IMPLANT
DRAPE SURG 17X11 SM STRL (DRAPES) ×3 IMPLANT
DRAPE UNDER BUTTOCK W/FLU (DRAPES) ×3 IMPLANT
ELECT REM PT RETURN 9FT ADLT (ELECTROSURGICAL) ×3
ELECTRODE REM PT RTRN 9FT ADLT (ELECTROSURGICAL) ×2 IMPLANT
GLOVE BIO SURGEON STRL SZ8 (GLOVE) ×24 IMPLANT
GOWN STRL REUS W/ TWL LRG LVL3 (GOWN DISPOSABLE) ×6 IMPLANT
GOWN STRL REUS W/ TWL XL LVL3 (GOWN DISPOSABLE) ×2 IMPLANT
GOWN STRL REUS W/TWL LRG LVL3 (GOWN DISPOSABLE) ×3
GOWN STRL REUS W/TWL XL LVL3 (GOWN DISPOSABLE) ×1
KIT RM TURNOVER CYSTO AR (KITS) ×3 IMPLANT
LABEL OR SOLS (LABEL) ×3 IMPLANT
NDL SAFETY 22GX1.5 (NEEDLE) ×3 IMPLANT
PACK BASIN MINOR ARMC (MISCELLANEOUS) ×3 IMPLANT
PAD OB MATERNITY 4.3X12.25 (PERSONAL CARE ITEMS) ×3 IMPLANT
PAD PREP 24X41 OB/GYN DISP (PERSONAL CARE ITEMS) ×3 IMPLANT
SUT PDS 2-0 27IN (SUTURE) ×3 IMPLANT
SUT VIC AB 0 CT1 27 (SUTURE) ×4
SUT VIC AB 0 CT1 27XCR 8 STRN (SUTURE) ×8 IMPLANT
SUT VIC AB 0 CT1 36 (SUTURE) ×3 IMPLANT
SUT VIC AB 2-0 SH 27 (SUTURE) ×1
SUT VIC AB 2-0 SH 27XBRD (SUTURE) ×2 IMPLANT
SYR CONTROL 10ML (SYRINGE) ×3 IMPLANT
SYRINGE 10CC LL (SYRINGE) ×3 IMPLANT
WATER STERILE IRR 1000ML POUR (IV SOLUTION) ×3 IMPLANT

## 2016-05-01 NOTE — Anesthesia Postprocedure Evaluation (Signed)
Anesthesia Post Note  Patient: Debra Nunez  Procedure(s) Performed: Procedure(s) (LRB): HYSTERECTOMY VAGINAL (N/A) BILATERAL SALPINGECTOMY (Bilateral)  Patient location during evaluation: PACU Anesthesia Type: General Level of consciousness: awake and alert and oriented Pain management: pain level controlled Vital Signs Assessment: post-procedure vital signs reviewed and stable Respiratory status: spontaneous breathing Cardiovascular status: blood pressure returned to baseline Anesthetic complications: no     Last Vitals:  Vitals:   05/01/16 1401 05/01/16 1434  BP: (!) 142/79 (!) 148/71  Pulse: 86 73  Resp: 20 20  Temp: 36.4 C 37.2 C    Last Pain:  Vitals:   05/01/16 1449  TempSrc:   PainSc: 8                  [name]

## 2016-05-01 NOTE — Anesthesia Preprocedure Evaluation (Addendum)
Anesthesia Evaluation  Patient identified by MRN, date of birth, ID band Patient awake    Airway Mallampati: II  TM Distance: >3 FB     Dental  (+) Chipped, Teeth Intact   Pulmonary sleep apnea ,    Pulmonary exam normal        Cardiovascular hypertension, + DVT  Normal cardiovascular exam     Neuro/Psych negative neurological ROS  negative psych ROS   GI/Hepatic   Endo/Other  diabetes, Well Controlled  Renal/GU   Female GU complaint     Musculoskeletal negative musculoskeletal ROS (+)   Abdominal Normal abdominal exam  (+)   Peds negative pediatric ROS (+)  Hematology  (+) anemia ,   Anesthesia Other Findings Past Medical History: No date: Anemia No date: Diabetes mellitus without complication ([place])     Comment: no meds 2013: DVT (deep venous thrombosis) (HCC)     Comment: right arm No date: Fibroids, intramural No date: H/O vitamin D deficiency No date: Hypertension     Comment: off bp meds since sep 2017 No date: Intramural leiomyoma of uterus No date: Sleep apnea     Comment: no cpap  Reproductive/Obstetrics                            Anesthesia Physical Anesthesia Plan  ASA: III  Anesthesia Plan: General   Post-op Pain Management:    Induction: Intravenous and Rapid sequence  Airway Management Planned: Oral ETT  Additional Equipment:   Intra-op Plan:   Post-operative Plan: Extubation in OR  Informed Consent: I have reviewed the patients History and Physical, chart, labs and discussed the procedure including the risks, benefits and alternatives for the proposed anesthesia with the patient or authorized representative who has indicated his/her understanding and acceptance.   Dental advisory given  Plan Discussed with: CRNA and Surgeon  Anesthesia Plan Comments:       Anesthesia Quick Evaluation

## 2016-05-01 NOTE — Progress Notes (Signed)
Pt ready for TVH and bilateral salpingectomy . Labs nl . Neg HCG . NPO. All questions answered .

## 2016-05-01 NOTE — Transfer of Care (Signed)
Immediate Anesthesia Transfer of Care Note  Patient: Debra Nunez  Procedure(s) Performed: Procedure(s): HYSTERECTOMY VAGINAL (N/A) BILATERAL SALPINGECTOMY (Bilateral)  Patient Location: PACU  Anesthesia Type:General  Level of Consciousness: awake, alert , oriented and patient cooperative  Airway & Oxygen Therapy: Patient Spontanous Breathing and Patient connected to nasal cannula oxygen  Post-op Assessment: Report given to RN and Post -op Vital signs reviewed and stable  Post vital signs: Reviewed and stable  Last Vitals:  Vitals:   05/01/16 0848 05/01/16 1301  BP: 140/81 120/67  Pulse: 67 89  Resp: 18 10  Temp: 36.8 C     Last Pain:  Vitals:   05/01/16 1301  TempSrc: Temporal         Complications: No apparent anesthesia complications

## 2016-05-01 NOTE — Anesthesia Post-op Follow-up Note (Cosign Needed)
Anesthesia QCDR form completed.        

## 2016-05-01 NOTE — Anesthesia Procedure Notes (Signed)
Procedure Name: Intubation Date/Time: 05/01/2016 10:35 AM Performed by: Darlyne Russian Pre-anesthesia Checklist: Patient identified, Emergency Drugs available, Suction available, Patient being monitored and Timeout performed Patient Re-evaluated:Patient Re-evaluated prior to inductionOxygen Delivery Method: Circle system utilized Preoxygenation: Pre-oxygenation with 100% oxygen Intubation Type: IV induction and Rapid sequence Ventilation: Mask ventilation without difficulty Laryngoscope Size: Mac and 3 Grade View: Grade I Tube type: Oral Tube size: 7.0 mm Number of attempts: 1 Airway Equipment and Method: Stylet Placement Confirmation: ETT inserted through vocal cords under direct vision,  positive ETCO2 and breath sounds checked- equal and bilateral Secured at: 21 cm Tube secured with: Tape Dental Injury: Teeth and Oropharynx as per pre-operative assessment

## 2016-05-01 NOTE — Brief Op Note (Signed)
05/01/2016  12:38 PM  PATIENT:  Debra Nunez  46 y.o. female  PRE-OPERATIVE DIAGNOSIS:  Menorrhagia, Fibroid  POST-OPERATIVE DIAGNOSIS:  Same as above PROCEDURE:  Procedure(s): HYSTERECTOMY VAGINAL (N/A) BILATERAL SALPINGECTOMY (Bilateral)  SURGEON:  Surgeon(s) and Role:    * Boykin Nearing, MD - Primary    * Honor Loh Ward, MD  PHYSICIAN ASSISTANT: Desha , [place] student   ASSISTANTS: none   ANESTHESIA:   general  EBL:  300 cc IOF 1000cc UO 200 cc BLOOD ADMINISTERED:none  DRAINS: Urinary Catheter (Foley)   LOCAL MEDICATIONS USED:  LIDOCAINE  and Amount: 10 ml  SPECIMEN:  Source of Specimen:  cervix , uterus , bilateral fallopian tubes   DISPOSITION OF SPECIMEN:  PATHOLOGY  COUNTS:  YES  TOURNIQUET:  * No tourniquets in log *  DICTATION: .Other Dictation: Dictation Number verbal  PLAN OF CARE: Admit for overnight observation  PATIENT DISPOSITION:  PACU - hemodynamically stable.   Delay start of Pharmacological VTE agent (>24hrs) due to surgical blood loss or risk of bleeding: not applicable

## 2016-05-01 NOTE — Progress Notes (Signed)
Patient ID: Debra Nunez, female   DOB: [birth year], 46 y.o.   MRN: [id number] DOS doing well Pain in good control  PO intake good  VSS  Good urine output  A; Stable  P: Saline lock IV  D/c foley in am

## 2016-05-02 DIAGNOSIS — N920 Excessive and frequent menstruation with regular cycle: Secondary | ICD-10-CM | POA: Diagnosis not present

## 2016-05-02 LAB — CBC
HCT: 35.4 % (ref 35.0–47.0)
Hemoglobin: 11.8 g/dL — ABNORMAL LOW (ref 12.0–16.0)
MCH: 29.7 pg (ref 26.0–34.0)
MCHC: 33.4 g/dL (ref 32.0–36.0)
MCV: 89 fL (ref 80.0–100.0)
PLATELETS: 264 10*3/uL (ref 150–440)
RBC: 3.97 MIL/uL (ref 3.80–5.20)
RDW: 12.2 % (ref 11.5–14.5)
WBC: 10.5 10*3/uL (ref 3.6–11.0)

## 2016-05-02 LAB — BASIC METABOLIC PANEL WITH GFR
Anion gap: 6 (ref 5–15)
BUN: 8 mg/dL (ref 6–20)
CO2: 26 mmol/L (ref 22–32)
Calcium: 8.1 mg/dL — ABNORMAL LOW (ref 8.9–10.3)
Chloride: 103 mmol/L (ref 101–111)
Creatinine, Ser: 0.78 mg/dL (ref 0.44–1.00)
GFR calc Af Amer: >60 mL/min (ref >60)
GFR calc non Af Amer: >60 mL/min (ref >60)
Glucose, Bld: 132 mg/dL — ABNORMAL HIGH (ref 65–99)
Potassium: 3.5 mmol/L (ref 3.5–5.1)
Sodium: 135 mmol/L (ref 135–145)

## 2016-05-02 LAB — SURGICAL PATHOLOGY

## 2016-05-02 MED ORDER — ONDANSETRON HCL 4 MG PO TABS: 4.0000 mg | ORAL_TABLET | Freq: Four times a day (QID) | ORAL | 0 refills | Status: AC | PRN

## 2016-05-02 MED ORDER — OXYCODONE-ACETAMINOPHEN 5-325 MG PO TABS: 1.0000 | ORAL_TABLET | ORAL | 0 refills | Status: AC | PRN

## 2016-05-02 MED ORDER — DOCUSATE SODIUM 100 MG PO CAPS: 100.0000 mg | ORAL_CAPSULE | Freq: Two times a day (BID) | ORAL | 0 refills | Status: AC

## 2016-05-02 NOTE — Progress Notes (Signed)
D/C order from MD.  Reviewed d/c instructions and prescriptions with patient and answered any questions.  Patient d/c home via wheelchair by nursing/auxillary. 

## 2016-05-02 NOTE — Op Note (Signed)
Debra Nunez, Debra Nunez              ACCOUNT NO.:  [id number]  MEDICAL RECORD NO.:  [id number]  LOCATION:                                 FACILITY:  PHYSICIAN:  Laverta Baltimore, MDDATE OF BIRTH:  Dec 02, 1970  DATE OF PROCEDURE: DATE OF DISCHARGE:                              OPERATIVE REPORT   PREOPERATIVE DIAGNOSIS: 1. Fibroid uterus. 2. Menorrhagia.  POSTOPERATIVE DIAGNOSIS: 1. Fibroid uterus. 2. Menorrhagia.  PROCEDURE PERFORMED: 1. Total vaginal hysterectomy. 2. Bilateral salpingectomy.  SURGEON:  Laverta Baltimore, MD  ANESTHESIA:  General endotracheal anesthesia.  SURGEON:  Laverta Baltimore, MD.  FIRST ASSISTANT:  Honor Loh Ward, MD.  SECOND ASSISTANT:  PA student, [place].  INDICATIONS:  A 46 year old, gravida 2, para 2 patient with a long history of heavy menstrual flow.  The patient noted to have at least 3 fibroids measuring 4-6 cm on exam.  Endometrial biopsy, Pap smear, and ultrasound, all performed prior to the procedure.  DESCRIPTION OF PROCEDURE:  After adequate general endotracheal anesthesia, the patient was placed in dorsal supine position, legs in the candy-cane stirrups.  Abdomen, perineum, and vaginal prep were performed.  Time-out was performed.  Straight catheterization of the bladder yielded 100 mL of clear urine.  The patient did receive 2 g IV cefoxitin prior to commencement of the case.  A weighted speculum was placed in the posterior vaginal vault.  The anterior cervix and posterior cervix were grasped with thyroid tenacula.  A direct posterior colpotomy incision was made.  Once entered into the posterior cul-de- sac, the long-billed weighted speculum was placed.  The uterosacral ligaments were bilaterally clamped, transected, and suture ligated with 0 Vicryl suture.  The cervix was previously injected with 1% lidocaine with 1:100,000 epinephrine.  The anterior cervix was incised with the Bovie and the anterior cul-de-sac was  entered sharply without difficulty.  Deep retractor was placed within to elevate the bladder anteriorly.  The cardinal ligaments were bilaterally clamped, transected, suture ligated with 0 Vicryl suture, and the uterine arteries were bilaterally clamped, transected, and suture ligated with 0 Vicryl suture.  Given the size of the uterus approximately 14 weeks with multiple fibroids, the uterus was initially cored out in the central portion and several fibroids were removed to decompress the uterus. Ultimately, the cornea was clamped on the patient's left side, transected, and the pedicle was doubly ligated with 0 Vicryl suture. Similar procedure was repeated on the patient's left side after the cornea was doubly, however, was clamped, transected, and doubly ligated with 0 Vicryl suture.  Both fallopian tubes were identified, clamped, and removed and doubly ligated with 0 Vicryl suture.  Good hemostasis was noted and the vaginal cuff was then closed with 0 Vicryl suture in a running nonlocking fashion.  Additional figure-of-eight suture was placed in the middle to add for extra support.  Good hemostasis was noted at the end of the case.  Foley catheter was placed at the end of the case yielding additional 100 mL.  TOTAL URINE OUTPUT:  200 mL.  ESTIMATED BLOOD LOSS:  300 mL.  INTRAOPERATIVE FLUIDS:  1000 mL.  The patient tolerated the procedure well and was taken to recovery room in good condition.  ______________________________ Laverta Baltimore, MD   ______________________________ Laverta Baltimore, MD    TS/MEDQ  D:  05/01/2016  T:  05/01/2016  Job:  [id number]

## 2016-05-02 NOTE — Discharge Summary (Signed)
Physician Discharge Summary  Patient ID: Debra Nunez MRN: [id number] DOB/AGE: [birth year] 46 y.o.  Admit date: 05/01/2016 Discharge date: 05/02/2016  Admission Diagnoses:menorrhagia , fibroids   Discharge Diagnoses: same Active Problems:   Post-operative state   Discharged Condition: good  Hospital Course: pt underwent an un complicated TVH and bilateral salpingectomy  Consults: None  Significant Diagnostic Studies: labs: hct 35.4 , bun / cr 8/0.7  Treatments: surgery: as above  Discharge Exam: Blood pressure (!) 146/72, pulse 66, temperature 98.9 F (37.2 C), temperature source Oral, resp. rate 20, last menstrual period 05/01/2016, SpO2 99 %. General appearance: alert and cooperative Resp: clear to auscultation bilaterally Cardio: regular rate and rhythm, S1, S2 normal, no murmur, click, rub or gallop GI: soft, non-tender; bowel sounds normal; no masses,  no organomegaly  Disposition: 01-Home or Self Care  Discharge Instructions    Call MD for:  difficulty breathing, headache or visual disturbances    Complete by:  As directed    Call MD for:  extreme fatigue    Complete by:  As directed    Call MD for:  hives    Complete by:  As directed    Call MD for:  persistant dizziness or light-headedness    Complete by:  As directed    Call MD for:  persistant nausea and vomiting    Complete by:  As directed    Call MD for:  redness, tenderness, or signs of infection (pain, swelling, redness, odor or green/yellow discharge around incision site)    Complete by:  As directed    Call MD for:  severe uncontrolled pain    Complete by:  As directed    Call MD for:  temperature >100.4    Complete by:  As directed    Diet - low sodium heart healthy    Complete by:  As directed    Discharge instructions    Complete by:  As directed    Pelvic rest 30 days   Increase activity slowly    Complete by:  As directed      Allergies as of 05/02/2016      Reactions   Amlodipine  Swelling   Tramadol Itching      Medication List    STOP taking these medications   FERRO-BOB 325 (65 FE) MG tablet Generic drug:  ferrous sulfate     TAKE these medications   CALCIUM-MAGNESIUM-ZINC PO Take 1 tablet by mouth daily.   docusate sodium 100 MG capsule Commonly known as:  COLACE Take 1 capsule (100 mg total) by mouth 2 (two) times daily.   ergocalciferol 50000 units capsule Commonly known as:  VITAMIN D2 Take 50,000 Units by mouth once a week.   hydrochlorothiazide 25 MG tablet Commonly known as:  HYDRODIURIL Take 25 mg by mouth daily as needed (swelling).   ondansetron 4 MG tablet Commonly known as:  ZOFRAN Take 1 tablet (4 mg total) by mouth every 6 (six) hours as needed for nausea.   oxyCODONE-acetaminophen 5-325 MG tablet Commonly known as:  PERCOCET/ROXICET Take 1-2 tablets by mouth every 4 (four) hours as needed (moderate to severe pain (when tolerating fluids)).   Vitamin B-12 5000 MCG Subl Place 5,000 mcg under the tongue daily.      Follow-up Information    Libia Fazzini, MD Follow up in 2 week(s).   Specialty:  Obstetrics and Gynecology Why:  post op Contact information: [address] [place] [address] [phone]  Signed: Iram Astorino 05/02/2016, 9:45 AM

## 2016-05-09 ENCOUNTER — Other Ambulatory Visit: Payer: 59

## 2016-08-10 ENCOUNTER — Other Ambulatory Visit: Payer: Self-pay | Admitting: Family Medicine

## 2016-08-10 DIAGNOSIS — Z1231 Encounter for screening mammogram for malignant neoplasm of breast: Secondary | ICD-10-CM

## 2016-08-17 ENCOUNTER — Ambulatory Visit
Admission: RE | Admit: 2016-08-17 | Discharge: 2016-08-17 | Disposition: A | Discharge status: Home / Self Care | Payer: BLUE CROSS/BLUE SHIELD | Source: Ambulatory Visit | Attending: Family Medicine | Admitting: Family Medicine

## 2016-08-17 ENCOUNTER — Encounter: Payer: Self-pay | Admitting: Radiology

## 2016-08-17 DIAGNOSIS — Z1231 Encounter for screening mammogram for malignant neoplasm of breast: Secondary | ICD-10-CM | POA: Diagnosis not present

## 2017-10-09 ENCOUNTER — Other Ambulatory Visit: Payer: Self-pay | Admitting: Family Medicine

## 2017-10-09 DIAGNOSIS — Z1231 Encounter for screening mammogram for malignant neoplasm of breast: Secondary | ICD-10-CM

## 2017-10-16 ENCOUNTER — Ambulatory Visit
Admission: RE | Admit: 2017-10-16 | Discharge: 2017-10-16 | Disposition: A | Discharge status: Home / Self Care | Payer: BLUE CROSS/BLUE SHIELD | Source: Ambulatory Visit | Attending: Family Medicine | Admitting: Family Medicine

## 2017-10-16 ENCOUNTER — Encounter (INDEPENDENT_AMBULATORY_CARE_PROVIDER_SITE_OTHER): Payer: Self-pay

## 2017-10-16 DIAGNOSIS — Z1231 Encounter for screening mammogram for malignant neoplasm of breast: Secondary | ICD-10-CM | POA: Insufficient documentation

## 2018-02-04 IMAGING — MG MM DIGITAL SCREENING BILAT W/ CAD
4 series · 4 of 4 positions shown · non-contrast
Comparison: Previous exam(s).

CLINICAL DATA: Screening.

EXAM:
DIGITAL SCREENING BILATERAL MAMMOGRAM WITH CAD

[R MLO]
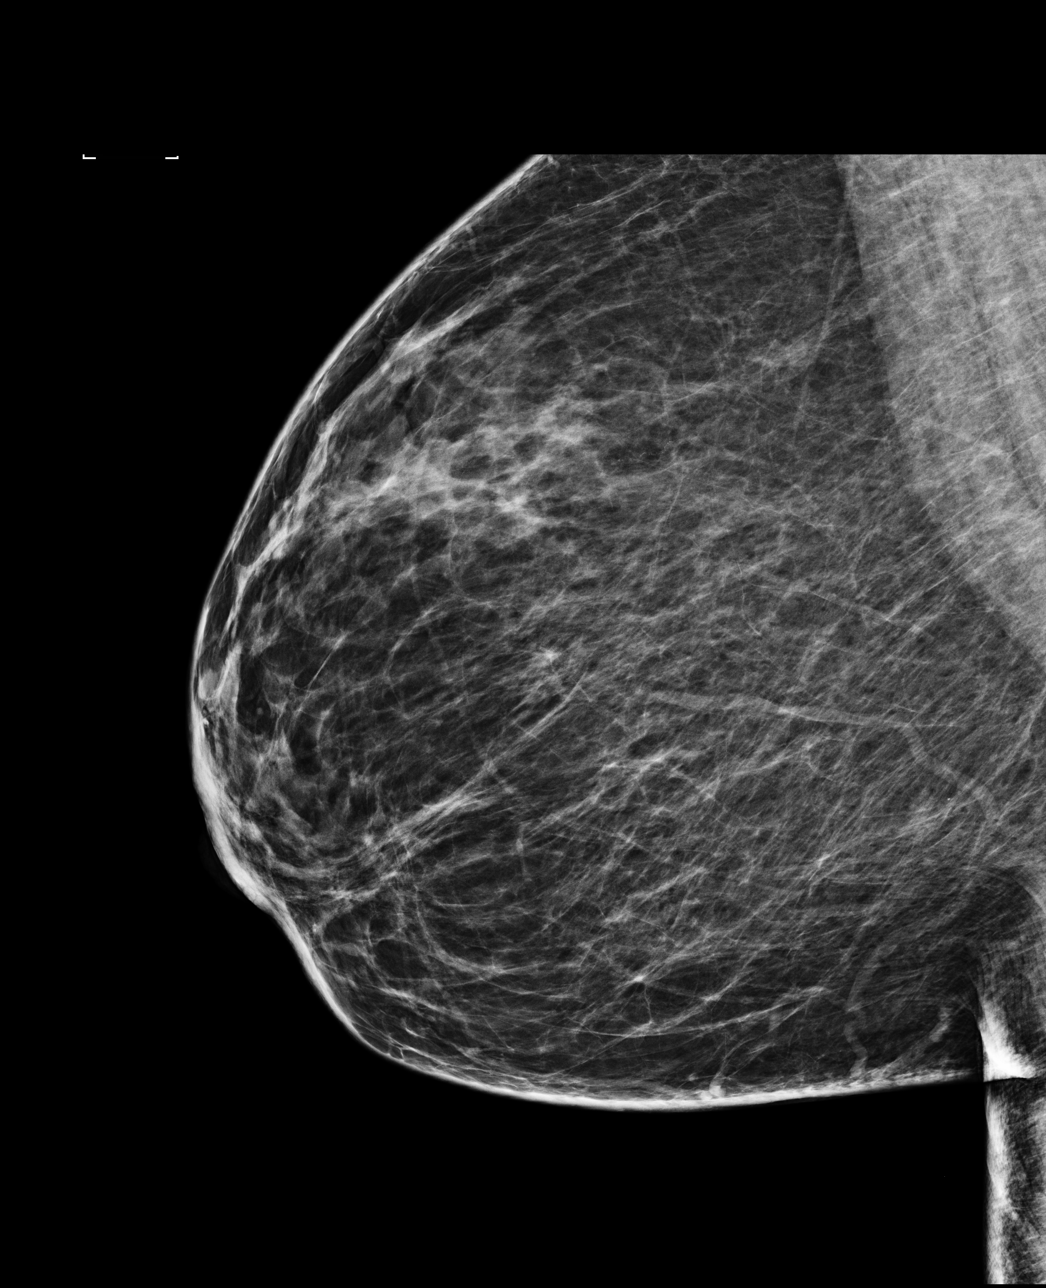

[R CC]
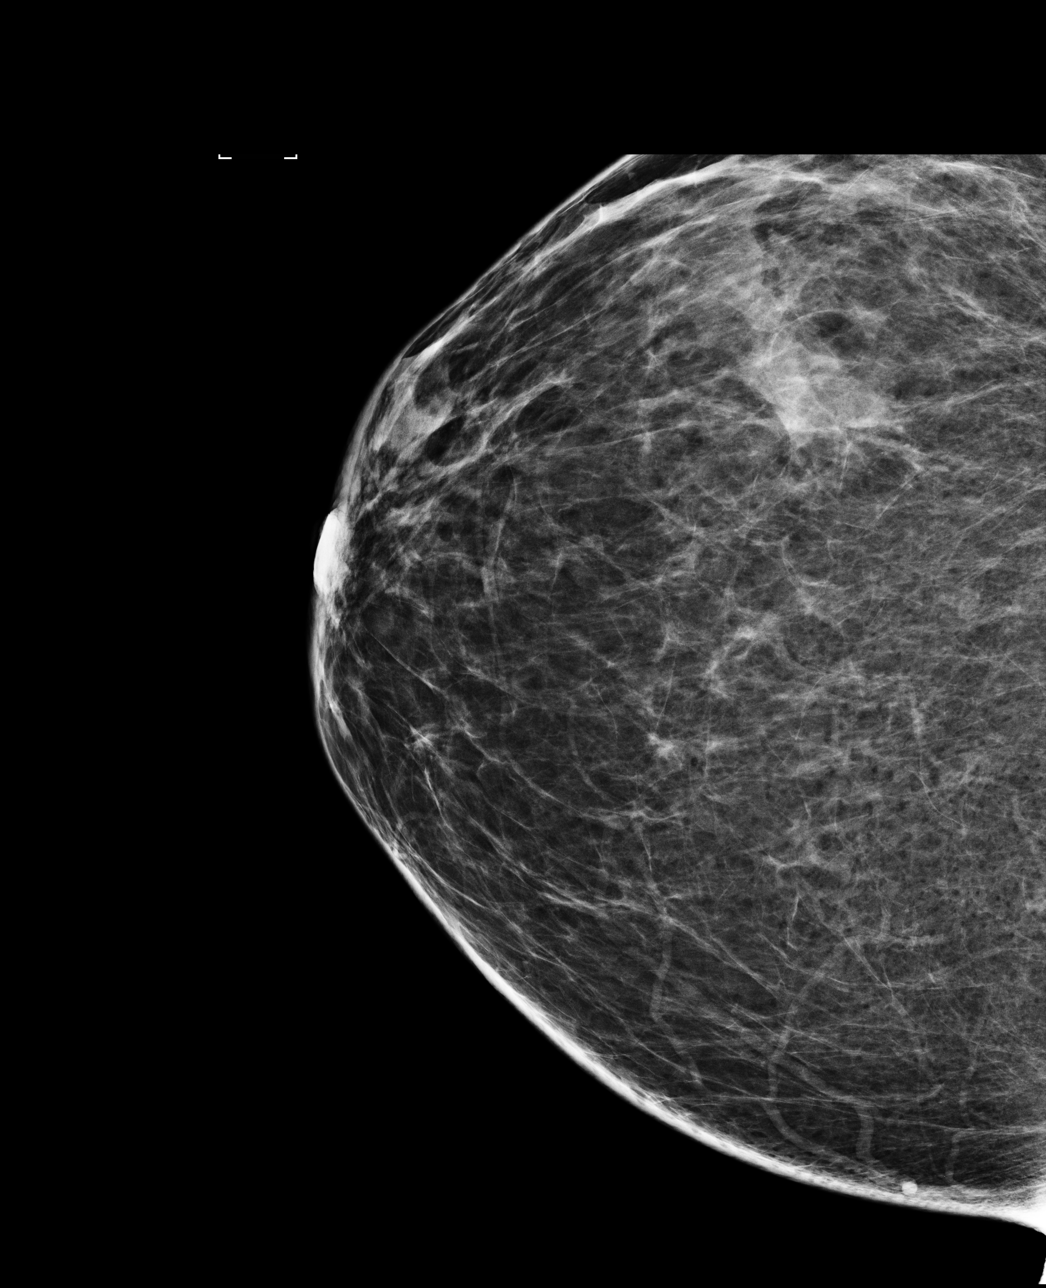

[L MLO]
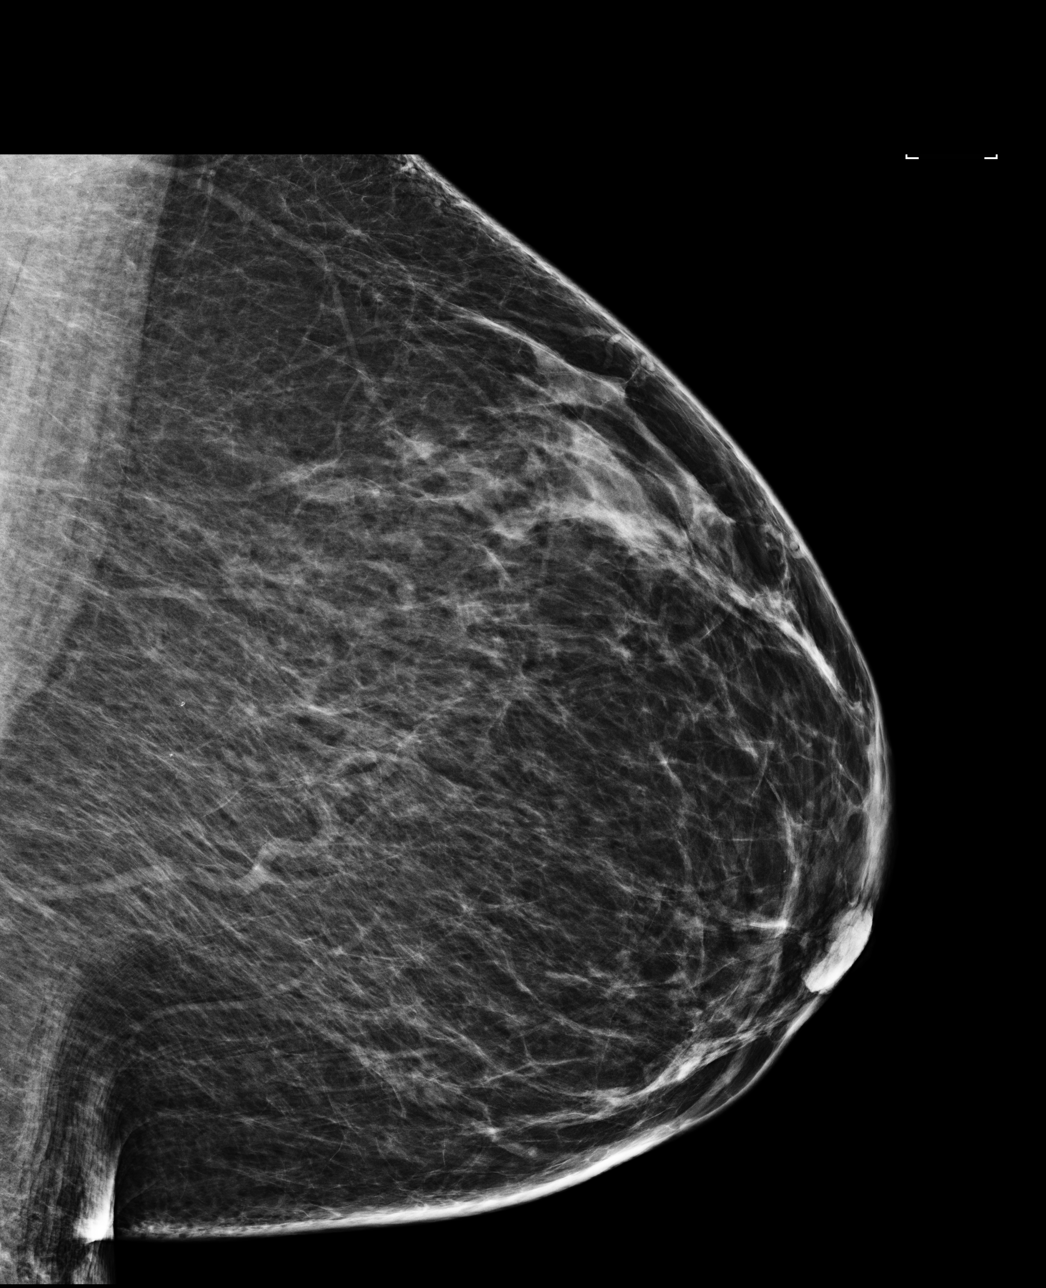

[L CC]
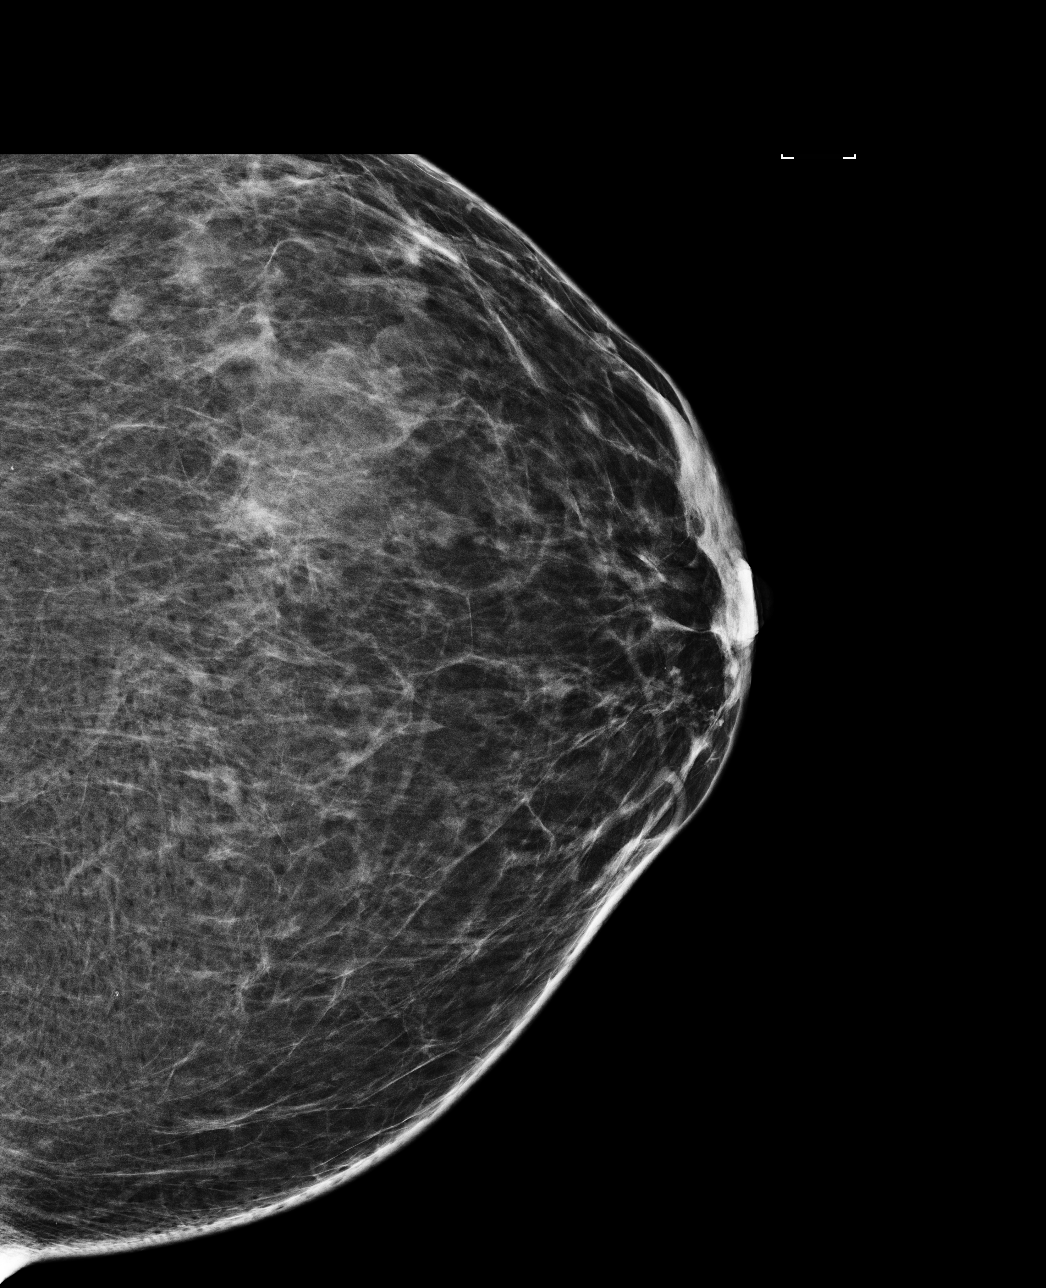

[4 of 4 positions shown; findings below may reference images not displayed]

ACR Breast Density Category b: There are scattered areas of
fibroglandular density.
FINDINGS: There are no findings suspicious for malignancy. Images were
processed with CAD.
IMPRESSION: No mammographic evidence of malignancy. A result letter of this
screening mammogram will be mailed directly to the patient.

RECOMMENDATION:
Screening mammogram in one year. (Code:AS-G-LCT)

BI-RADS CATEGORY  1: Negative.

## 2018-10-21 ENCOUNTER — Other Ambulatory Visit: Payer: Self-pay | Admitting: Family Medicine

## 2018-10-21 DIAGNOSIS — Z1231 Encounter for screening mammogram for malignant neoplasm of breast: Secondary | ICD-10-CM

## 2018-11-06 ENCOUNTER — Inpatient Hospital Stay: Admission: RE | Admit: 2018-11-06 | Payer: BLUE CROSS/BLUE SHIELD | Source: Ambulatory Visit

## 2018-12-03 ENCOUNTER — Encounter (INDEPENDENT_AMBULATORY_CARE_PROVIDER_SITE_OTHER): Payer: Self-pay

## 2018-12-03 ENCOUNTER — Ambulatory Visit
Admission: RE | Admit: 2018-12-03 | Discharge: 2018-12-03 | Disposition: A | Discharge status: Home / Self Care | Payer: BC Managed Care – PPO | Source: Ambulatory Visit | Attending: Family Medicine | Admitting: Family Medicine

## 2018-12-03 ENCOUNTER — Other Ambulatory Visit: Payer: Self-pay

## 2018-12-03 DIAGNOSIS — Z1231 Encounter for screening mammogram for malignant neoplasm of breast: Secondary | ICD-10-CM | POA: Diagnosis not present

## 2019-10-02 ENCOUNTER — Other Ambulatory Visit: Payer: Self-pay | Admitting: Family Medicine

## 2019-10-02 DIAGNOSIS — Z1231 Encounter for screening mammogram for malignant neoplasm of breast: Secondary | ICD-10-CM

## 2019-12-04 ENCOUNTER — Ambulatory Visit
Admission: RE | Admit: 2019-12-04 | Discharge: 2019-12-04 | Disposition: A | Discharge status: Home / Self Care | Payer: BC Managed Care – PPO | Source: Ambulatory Visit | Attending: Family Medicine | Admitting: Family Medicine

## 2019-12-04 ENCOUNTER — Other Ambulatory Visit: Payer: Self-pay

## 2019-12-04 DIAGNOSIS — Z1231 Encounter for screening mammogram for malignant neoplasm of breast: Secondary | ICD-10-CM

## 2020-10-20 ENCOUNTER — Other Ambulatory Visit: Payer: Self-pay | Admitting: Family Medicine

## 2020-10-20 DIAGNOSIS — R609 Edema, unspecified: Secondary | ICD-10-CM

## 2020-10-21 ENCOUNTER — Other Ambulatory Visit: Payer: Self-pay

## 2020-10-21 ENCOUNTER — Ambulatory Visit
Admission: RE | Admit: 2020-10-21 | Discharge: 2020-10-21 | Disposition: A | Discharge status: Home / Self Care | Payer: BC Managed Care – PPO | Source: Ambulatory Visit | Attending: Family Medicine | Admitting: Family Medicine

## 2020-10-21 DIAGNOSIS — R609 Edema, unspecified: Secondary | ICD-10-CM

## 2020-10-25 ENCOUNTER — Ambulatory Visit: Admission: RE | Admit: 2020-10-25 | Payer: BC Managed Care – PPO | Source: Ambulatory Visit

## 2020-11-08 ENCOUNTER — Other Ambulatory Visit: Payer: Self-pay | Admitting: Family Medicine

## 2020-11-08 DIAGNOSIS — Z1231 Encounter for screening mammogram for malignant neoplasm of breast: Secondary | ICD-10-CM

## 2020-12-07 ENCOUNTER — Other Ambulatory Visit: Payer: Self-pay

## 2020-12-07 ENCOUNTER — Ambulatory Visit
Admission: RE | Admit: 2020-12-07 | Discharge: 2020-12-07 | Disposition: A | Discharge status: Home / Self Care | Payer: BC Managed Care – PPO | Source: Ambulatory Visit | Attending: Family Medicine | Admitting: Family Medicine

## 2020-12-07 DIAGNOSIS — Z1231 Encounter for screening mammogram for malignant neoplasm of breast: Secondary | ICD-10-CM | POA: Insufficient documentation

## 2020-12-30 ENCOUNTER — Ambulatory Visit: Payer: BC Managed Care – PPO

## 2021-01-02 ENCOUNTER — Ambulatory Visit
Admission: EM | Admit: 2021-01-02 | Discharge: 2021-01-02 | Disposition: A | Discharge status: Home / Self Care | Payer: BC Managed Care – PPO

## 2021-01-02 ENCOUNTER — Other Ambulatory Visit: Payer: Self-pay

## 2021-01-02 DIAGNOSIS — L02212 Cutaneous abscess of back [any part, except buttock]: Secondary | ICD-10-CM

## 2021-01-02 MED ORDER — SULFAMETHOXAZOLE-TRIMETHOPRIM 800-160 MG PO TABS: 1.0000 | ORAL_TABLET | Freq: Two times a day (BID) | ORAL | 0 refills | Status: AC

## 2021-01-02 NOTE — ED Provider Notes (Signed)
[facility]    CSN: [id number] Arrival date & time: 01/02/21  1200      History   Chief Complaint Chief Complaint  Patient presents with   Cyst    HPI Debra Nunez is a 50 y.o. female who presents with a tender cyst on low back x 5 days. Has had a small one there 10 y ago, but is larger no. It has never hurt before    Past Medical History:  Diagnosis Date   Anemia    Diabetes mellitus without complication ([place])    no meds   DVT (deep venous thrombosis) ([place]) 2013   right arm   Fibroids, intramural    H/O vitamin D deficiency    Hypertension    off bp meds since sep 2017   Intramural leiomyoma of uterus    Sleep apnea    no cpap    Patient Active Problem List   Diagnosis Date Noted   Post-operative state 05/01/2016    Past Surgical History:  Procedure Laterality Date   ABDOMINAL HYSTERECTOMY  04/2016   BARIATRIC SURGERY     BILATERAL SALPINGECTOMY Bilateral 05/01/2016   Procedure: BILATERAL SALPINGECTOMY;  Surgeon: Boykin Nearing, MD;  Location: [facility];  Service: Gynecology;  Laterality: Bilateral;   ENDOMETRIAL BIOPSY     NASAL POLYP EXCISION     TONSILLECTOMY     TUBAL LIGATION     VAGINAL HYSTERECTOMY N/A 05/01/2016   Procedure: HYSTERECTOMY VAGINAL;  Surgeon: Boykin Nearing, MD;  Location: [facility];  Service: Gynecology;  Laterality: N/A;    OB History   No obstetric history on file.      Home Medications    Prior to Admission medications   Medication Sig Start Date End Date Taking? Authorizing Provider  buPROPion (WELLBUTRIN XL) 150 MG 24 hr tablet TAKE 1 TABLET(150 MG) BY MOUTH EVERY DAY 09/01/20  Yes [provider]  clonazePAM (KLONOPIN) 0.5 MG tablet Take by mouth. 06/12/18  Yes [provider]  atorvastatin (LIPITOR) 40 MG tablet Take 40 mg by mouth daily as needed. 10/08/20   [provider]  CALCIUM-MAGNESIUM-ZINC PO Take 1 tablet by mouth daily.    [provider]   Cyanocobalamin (VITAMIN B-12) 5000 MCG SUBL Place 5,000 mcg under the tongue daily.    [provider]  docusate sodium (COLACE) 100 MG capsule Take 1 capsule (100 mg total) by mouth 2 (two) times daily. 05/02/16   Schermerhorn, Gwen Her, MD  ergocalciferol (VITAMIN D2) 50000 units capsule Take 50,000 Units by mouth once a week.    [provider]  ferrous sulfate 325 (65 FE) MG tablet Take by mouth.    [provider]  glipiZIDE (GLUCOTROL XL) 10 MG 24 hr tablet Take 10 mg by mouth daily. 01/02/21   [provider]  hydrochlorothiazide (HYDRODIURIL) 25 MG tablet Take 25 mg by mouth daily as needed (swelling).     [provider]  mometasone (NASONEX) 50 MCG/ACT nasal spray 2 sprays daily. 07/29/20   [provider]  olmesartan (BENICAR) 40 MG tablet Take 40 mg by mouth daily. 01/02/21   [provider]  ondansetron (ZOFRAN) 4 MG tablet Take 1 tablet (4 mg total) by mouth every 6 (six) hours as needed for nausea. 05/02/16   Schermerhorn, Gwen Her, MD  oxyCODONE-acetaminophen (PERCOCET/ROXICET) 5-325 MG tablet Take 1-2 tablets by mouth every 4 (four) hours as needed (moderate to severe pain (when tolerating fluids)). 05/02/16   Schermerhorn, Marcello Moores  J, MD  OZEMPIC, 1 MG/DOSE, 4 MG/3ML SOPN Inject into the skin. 12/31/20   [provider]  potassium chloride SA (KLOR-CON) 20 MEQ tablet Take 20 mEq by mouth 2 (two) times daily. 10/08/20   [provider]  spironolactone (ALDACTONE) 50 MG tablet Take 50 mg by mouth daily. 12/30/20   [provider]    Family History Family History  Problem Relation Age of Onset   Breast cancer Neg Hx     Social History Social History   Tobacco Use   Smoking status: Never   Smokeless tobacco: Never  Substance Use Topics   Alcohol use: Yes    Comment: social   Drug use: No     Allergies   Amlodipine, Nsaids, Oxycodone-acetaminophen, and Tramadol   Review of  Systems Review of Systems  Constitutional:  Negative for chills, diaphoresis and fever.  Musculoskeletal:  Negative for gait problem.  Skin:  Positive for color change. Negative for rash.       Lump with redness on lower central back which is tender    Physical Exam Triage Vital Signs ED Triage Vitals  Enc Vitals Group     BP 01/02/21 1231 (!) 172/100     Pulse Rate 01/02/21 1231 (!) 106     Resp 01/02/21 1231 12     Temp 01/02/21 1231 98.8 F (37.1 C)     Temp Source 01/02/21 1231 Oral     SpO2 01/02/21 1231 98 %     Weight --      Height --      Head Circumference --      Peak Flow --      Pain Score 01/02/21 1240 6     Pain Loc --      Pain Edu? --      Excl. in [place]? --    No data found.  Updated Vital Signs BP (!) 172/100 (BP Location: Right Arm)   Pulse (!) 106   Temp 98.8 F (37.1 C) (Oral)   Resp 12   LMP 05/01/2016   SpO2 98%   Visual Acuity Right Eye Distance:   Left Eye Distance:   Bilateral Distance:    Right Eye Near:   Left Eye Near:    Bilateral Near:     Physical Exam Vitals and nursing note reviewed.  Constitutional:      General: She is not in acute distress.    Appearance: She is not toxic-appearing.  HENT:     Right Ear: External ear normal.     Left Ear: External ear normal.  Eyes:     General: No scleral icterus.    Conjunctiva/sclera: Conjunctivae normal.  Pulmonary:     Effort: Pulmonary effort is normal.  Musculoskeletal:     Cervical back: Neck supple.  Skin:    General: Skin is warm and dry.     Comments: 3x 4 cm indurated erythematous mass on central low back. No fluctuation present  Neurological:     Mental Status: She is alert and oriented to person, place, and time.     Gait: Gait normal.  Psychiatric:        Mood and Affect: Mood normal.        Behavior: Behavior normal.        Thought Content: Thought content normal.        Judgment: Judgment normal.     UC Treatments / Results  Labs (all labs ordered are  listed, but only abnormal  results are displayed) Labs Reviewed - No data to display  EKG   Radiology No results found.  Procedures Procedures (including critical care time)  Medications Ordered in UC Medications - No data to display  Initial Impression / Assessment and Plan / UC Course  I have reviewed the triage vital signs and the nursing notes. I placed her on Bactrim DS as noted. See instructions    Final Clinical Impressions(s) / UC Diagnoses   Final diagnoses:  None   Discharge Instructions   None    ED Prescriptions   None    PDMP not reviewed this encounter.   Shelby Mattocks, PA-C 01/02/21 1727

## 2021-01-02 NOTE — ED Triage Notes (Addendum)
Patient presents to Urgent Care with complaints of cyst located on her lower back that appeared Tuesday. Pt states she has had a small cyst for 10 years but it has increased in size.   Denies fever or drainage.

## 2021-01-02 NOTE — Discharge Instructions (Addendum)
Use heat on area for 20 minutes 3-4 times a day for 5 days or til it drains. If the area gets larger or more painful, it may need to be drained. Today is too hardened and is not ready to be drain.

## 2022-01-13 ENCOUNTER — Other Ambulatory Visit: Payer: Self-pay | Admitting: Family Medicine

## 2022-01-13 DIAGNOSIS — Z1231 Encounter for screening mammogram for malignant neoplasm of breast: Secondary | ICD-10-CM

## 2022-02-01 ENCOUNTER — Ambulatory Visit
Admission: RE | Admit: 2022-02-01 | Discharge: 2022-02-01 | Disposition: A | Discharge status: Home / Self Care | Payer: BC Managed Care – PPO | Source: Ambulatory Visit | Attending: Family Medicine | Admitting: Family Medicine

## 2022-02-01 DIAGNOSIS — Z1231 Encounter for screening mammogram for malignant neoplasm of breast: Secondary | ICD-10-CM | POA: Insufficient documentation

## 2022-05-06 LAB — AMB RESULTS CONSOLE CBG: Glucose: 133

## 2022-05-15 ENCOUNTER — Encounter: Payer: Self-pay | Admitting: *Deleted

## 2022-05-15 NOTE — Progress Notes (Signed)
Pt has established PCP, Dr. Salome Holmes, [place], with whom she has had ongoing care throughout the past rolling 12 months. Her most recent PCP visit b/p was 04/20/22, when bp was 153/91, so 2/17 b/p  of 142/88 was an improvement. Pt is also a known diabetic and has scheduled HTN and DM f/u appt with PCP on 09/22/22.  No SDOH barriers noted as of this 05/06/22 event. No additional health equity team support indicated at this time.

## 2022-08-04 ENCOUNTER — Ambulatory Visit: Admit: 2022-08-04 | Payer: BC Managed Care – PPO

## 2022-12-22 ENCOUNTER — Other Ambulatory Visit: Payer: Self-pay | Admitting: Family Medicine

## 2022-12-22 DIAGNOSIS — R1031 Right lower quadrant pain: Secondary | ICD-10-CM

## 2022-12-26 ENCOUNTER — Ambulatory Visit
Admission: RE | Admit: 2022-12-26 | Discharge: 2022-12-26 | Disposition: A | Discharge status: Home / Self Care | Payer: BC Managed Care – PPO | Source: Ambulatory Visit | Attending: Family Medicine | Admitting: Family Medicine

## 2022-12-26 DIAGNOSIS — R1031 Right lower quadrant pain: Secondary | ICD-10-CM | POA: Diagnosis present

## 2022-12-26 DIAGNOSIS — R1032 Left lower quadrant pain: Secondary | ICD-10-CM | POA: Insufficient documentation

## 2022-12-26 MED ORDER — IOHEXOL 300 MG/ML  SOLN
100.0000 mL | Freq: Once | INTRAMUSCULAR | Status: AC | PRN
  Administered 2022-12-26: 100 mL via INTRAVENOUS

## 2023-01-09 ENCOUNTER — Other Ambulatory Visit: Payer: Self-pay | Admitting: Family Medicine

## 2023-01-09 DIAGNOSIS — Z1231 Encounter for screening mammogram for malignant neoplasm of breast: Secondary | ICD-10-CM

## 2023-02-05 ENCOUNTER — Ambulatory Visit
Admission: RE | Admit: 2023-02-05 | Discharge: 2023-02-05 | Disposition: A | Discharge status: Home / Self Care | Payer: BC Managed Care – PPO | Source: Ambulatory Visit | Attending: Family Medicine | Admitting: Family Medicine

## 2023-02-05 DIAGNOSIS — Z1231 Encounter for screening mammogram for malignant neoplasm of breast: Secondary | ICD-10-CM | POA: Insufficient documentation

## 2024-01-08 ENCOUNTER — Other Ambulatory Visit: Payer: Self-pay | Admitting: Family Medicine

## 2024-01-08 DIAGNOSIS — M79662 Pain in left lower leg: Secondary | ICD-10-CM

## 2024-01-09 ENCOUNTER — Ambulatory Visit
Admission: RE | Admit: 2024-01-09 | Discharge: 2024-01-09 | Disposition: A | Discharge status: Home / Self Care | Source: Ambulatory Visit | Attending: Family Medicine | Admitting: Family Medicine

## 2024-01-09 DIAGNOSIS — M79662 Pain in left lower leg: Secondary | ICD-10-CM | POA: Diagnosis present

## 2024-01-30 ENCOUNTER — Other Ambulatory Visit: Payer: Self-pay | Admitting: Family Medicine

## 2024-01-30 DIAGNOSIS — Z1231 Encounter for screening mammogram for malignant neoplasm of breast: Secondary | ICD-10-CM

## 2024-03-06 ENCOUNTER — Ambulatory Visit: Admission: RE | Admit: 2024-03-06 | Discharge: 2024-03-06 | Attending: Family Medicine | Admitting: Family Medicine

## 2024-03-06 DIAGNOSIS — Z1231 Encounter for screening mammogram for malignant neoplasm of breast: Secondary | ICD-10-CM | POA: Insufficient documentation
